# Patient Record
Sex: Female | Born: 1980 | Race: White | Hispanic: No | Marital: Single | Smoking: Former smoker
Health system: Southern US, Community
[De-identification: ages and names within clinical notes are randomized; demographics above are authoritative.]

## PROBLEM LIST (undated history)

## (undated) DIAGNOSIS — R519 Headache, unspecified: Secondary | ICD-10-CM

## (undated) DIAGNOSIS — R011 Cardiac murmur, unspecified: Secondary | ICD-10-CM

## (undated) DIAGNOSIS — Q796 Ehlers-Danlos syndrome, unspecified: Secondary | ICD-10-CM

## (undated) DIAGNOSIS — R51 Headache: Secondary | ICD-10-CM

## (undated) DIAGNOSIS — D219 Benign neoplasm of connective and other soft tissue, unspecified: Secondary | ICD-10-CM

## (undated) HISTORY — PX: WISDOM TOOTH EXTRACTION: SHX21

---

## 2015-03-13 DIAGNOSIS — N920 Excessive and frequent menstruation with regular cycle: Secondary | ICD-10-CM | POA: Insufficient documentation

## 2015-07-05 ENCOUNTER — Other Ambulatory Visit: Payer: Self-pay | Admitting: Obstetrics and Gynecology

## 2015-07-05 DIAGNOSIS — D259 Leiomyoma of uterus, unspecified: Secondary | ICD-10-CM

## 2015-07-17 ENCOUNTER — Other Ambulatory Visit: Payer: Self-pay | Admitting: Interventional Radiology

## 2015-07-17 ENCOUNTER — Ambulatory Visit
Admission: RE | Admit: 2015-07-17 | Discharge: 2015-07-17 | Disposition: A | Discharge status: Home / Self Care | Payer: Self-pay | Source: Ambulatory Visit | Attending: Obstetrics and Gynecology | Admitting: Obstetrics and Gynecology

## 2015-07-17 DIAGNOSIS — D259 Leiomyoma of uterus, unspecified: Secondary | ICD-10-CM

## 2015-07-17 HISTORY — DX: Cardiac murmur, unspecified: R01.1

## 2015-07-17 HISTORY — DX: Headache, unspecified: R51.9

## 2015-07-17 HISTORY — DX: Headache: R51

## 2015-07-17 HISTORY — DX: Benign neoplasm of connective and other soft tissue, unspecified: D21.9

## 2015-07-17 HISTORY — DX: Ehlers-Danlos syndrome, unspecified: Q79.60

## 2015-07-17 NOTE — Consult Note (Signed)
Chief Complaint: Patient was seen in consultation today for  Chief Complaint  Patient presents with  . Advice Only    Consult for [place]    at the request of [place]  Referring Physician(s): Callahan,Sidney  History of Present Illness: Shelby Park is a 35 y.o. female presenting today to [facility], kindly referred by Dr. Rogue Bussing, to discuss [place] as an option for her symptomatic uterine fibroids.    Shelby Park reports symptoms in 2 of the 3 categories related to uterine fibroids. These include bleeding and bulk-type symptoms. She reports monthly cycles, with 14 days of bleeding, heavy clots and frequent tampon and pad changes. She does report inter-. Bleeding. In addition, she reports bulk-type symptoms with urinary frequency and a sensation of incomplete bladder emptying with pelvic heaviness. She does not have significant pain.  She has had a trial of medical therapy with birth control pills prescribed in 2016 with no change in her symptoms.  She has had 2 prior pregnancies, with one child, and reports that she has no desire for future pregnancies.  She has a negative Pap smear 01/31/2015.  She has not yet had a full conversation with her OB/GYN regarding surgical options such as hysterectomy and myomectomy.  I had a lengthy discussion with her regarding the anatomy, pathology/pathophysiology of uterine fibroids, and the goals of therapy. Specifically, I mentioned medical therapy, hysterectomy/myomectomy, high intensity focused ultrasound, and uterine artery embolization as possible treatments. She has artery failed medical therapy. Regarding uterine artery embolization, I discussed the excellent efficacy, quoting a high 90% success rate at 12 months. I discussed the procedure at length, and specific risks involved with the procedure, specifically bleeding, infection, endometritis/abscess, contrast reaction, arterial injury/dissection, need for further procedure/surgery  including hysterectomy, cardiopulmonary collapse, death. I also discussed the risk of early menopause. Regarding her history of collagen vascular disease, I did quote her a risk of arterial injury slightly above a baseline risk, perhaps 2%.  I discussed the usual postoperative course including post embolization syndrome and our planned medical therapy.   Past Medical History  Diagnosis Date  . Fibroids   . Heart murmur   . Ehlers-Danlos disease   . Headache     Past Surgical History  Procedure Laterality Date  . Wisdom tooth extraction      Allergies: Review of patient's allergies indicates no known allergies.  Medications: Prior to Admission medications   Medication Sig Start Date End Date Taking? Authorizing Provider  Ascorbic Acid (VITAMIN C) 100 MG tablet Take 100 mg by mouth daily.   Yes Historical Provider, MD  vitamin B-12 (CYANOCOBALAMIN) 100 MCG tablet Take 100 mcg by mouth daily.   Yes Historical Provider, MD  vitamin E 100 UNIT capsule Take 100 Units by mouth daily.   Yes Historical Provider, MD     No family history on file.  Social History   Social History  . Marital Status: Single    Spouse Name: N/A  . Number of Children: N/A  . Years of Education: N/A   Social History Main Topics  . Smoking status: Not on file  . Smokeless tobacco: Not on file  . Alcohol Use: Not on file  . Drug Use: Not on file  . Sexual Activity: Not on file   Other Topics Concern  . Not on file   Social History Narrative  . No narrative on file      Review of Systems: A 12 point ROS discussed and pertinent positives are indicated  in the HPI above.  All other systems are negative.  Review of Systems  Vital Signs: BP 106/66 mmHg  Pulse 75  Temp(Src) 98.4 F (36.9 C) (Oral)  Resp 14  Ht 5\' 7"  (1.702 m)  Wt 170 lb (77.111 kg)  BMI 26.62 kg/m2  SpO2 100%  LMP 06/20/2015 (Approximate)  Physical Exam  Atraumatic, normocephalic. Mucous membranes moist pink. Wearing  glasses. Conjugate gaze. Alert and oriented to person place and time. Appropriate affect and questions. Neck is soft supple without adenopathy. Full range of motion. Mallampati 2. Clear to auscultation bilaterally. No labored breathing. Regular rate and rhythm. No third heart sounds appreciated. No bruit at the left or right neck. Palpable distal pulses and radial pulses. No palpable pulsatile abdominal mass. Bowel sounds positive. Nontender abdomen. Genitourinary deferred. No lower extremity edema or skin changes.  Mallampati Score: 2  Imaging: No results found.  Labs:  CBC: No results for input(s): WBC, HGB, HCT, PLT in the last 8760 hours.  COAGS: No results for input(s): INR, APTT in the last 8760 hours.  BMP: No results for input(s): NA, K, CL, CO2, GLUCOSE, BUN, CALCIUM, CREATININE, GFRNONAA, GFRAA in the last 8760 hours.  Invalid input(s): CMP  LIVER FUNCTION TESTS: No results for input(s): BILITOT, AST, ALT, ALKPHOS, PROT, ALBUMIN in the last 8760 hours.  TUMOR MARKERS: No results for input(s): AFPTM, CEA, CA199, CHROMGRNA in the last 8760 hours.  Assessment and Plan:  Shelby Park is a 35 year old female with symptomatic uterine fibroids, and I believe she is a candidate for uterine artery embolization.  I also offered her a superior hypogastric block for pain control.   Although the concept of early menopause does not concern her from the standpoint of further pregnancies, she has some concerns regarding possible hormonal imbalance. She also had a few questions regarding the option of hysterectomy and myomectomy which I felt that are best answered by surgical consultation, and she agrees that she should pursue a conversation with her OB/GYN.  We will wait to order an MRI until she has a consultation with her physician regarding hysterectomy/myomectomy.  She agrees with our plan of care and will be in touch with any future questions/concerns, or if she feels uterine  artery embolization is her choice of treatment.   Thank you for this interesting consult.  I greatly enjoyed meeting Shelby Park and look forward to participating in their care.  A copy of this report was sent to the requesting provider on this date.  Electronically Signed: Corrie Mckusick 07/17/2015, 9:27 AM   I spent a total of  40 Minutes  in face to face in clinical consultation, greater than 50% of which was counseling/coordinating care for symptomatic uterine fibroids, and possible [place].

## 2015-12-12 DIAGNOSIS — D259 Leiomyoma of uterus, unspecified: Secondary | ICD-10-CM | POA: Insufficient documentation

## 2015-12-12 DIAGNOSIS — Q796 Ehlers-Danlos syndrome, unspecified: Secondary | ICD-10-CM | POA: Insufficient documentation

## 2016-02-25 ENCOUNTER — Other Ambulatory Visit: Payer: Self-pay | Admitting: Interventional Radiology

## 2016-02-25 DIAGNOSIS — D250 Submucous leiomyoma of uterus: Secondary | ICD-10-CM

## 2016-03-05 ENCOUNTER — Ambulatory Visit (HOSPITAL_COMMUNITY)
Admission: RE | Admit: 2016-03-05 | Discharge: 2016-03-05 | Disposition: A | Discharge status: Home / Self Care | Payer: BLUE CROSS/BLUE SHIELD | Source: Ambulatory Visit | Attending: Interventional Radiology | Admitting: Interventional Radiology

## 2016-03-05 DIAGNOSIS — D250 Submucous leiomyoma of uterus: Secondary | ICD-10-CM | POA: Insufficient documentation

## 2016-03-05 DIAGNOSIS — N808 Other endometriosis: Secondary | ICD-10-CM | POA: Insufficient documentation

## 2016-03-05 DIAGNOSIS — D259 Leiomyoma of uterus, unspecified: Secondary | ICD-10-CM | POA: Diagnosis not present

## 2016-03-05 DIAGNOSIS — N801 Endometriosis of ovary: Secondary | ICD-10-CM | POA: Insufficient documentation

## 2016-03-05 MED ORDER — GADOBENATE DIMEGLUMINE 529 MG/ML IV SOLN
15.0000 mL | Freq: Once | INTRAVENOUS | Status: AC | PRN
  Administered 2016-03-05: 15 mL via INTRAVENOUS

## 2016-03-06 ENCOUNTER — Other Ambulatory Visit: Payer: Self-pay | Admitting: Interventional Radiology

## 2016-03-06 DIAGNOSIS — D259 Leiomyoma of uterus, unspecified: Secondary | ICD-10-CM

## 2016-03-13 ENCOUNTER — Ambulatory Visit
Admission: RE | Admit: 2016-03-13 | Discharge: 2016-03-13 | Disposition: A | Discharge status: Home / Self Care | Payer: BLUE CROSS/BLUE SHIELD | Source: Ambulatory Visit | Attending: Interventional Radiology | Admitting: Interventional Radiology

## 2016-03-13 DIAGNOSIS — D259 Leiomyoma of uterus, unspecified: Secondary | ICD-10-CM

## 2016-03-13 HISTORY — PX: IR GENERIC HISTORICAL: IMG1180011

## 2016-03-13 NOTE — Progress Notes (Signed)
Chief Complaint: Uterine Fibroids  Referring Physician(s): Dr. Rogue Bussing  History of Present Illness: Shelby Park is a 35 y.o. female presenting today to vascular and interventional radiology clinic as a follow-up appointment to her prior consultation of January 2017 for symptomatic uterine fibroids.   Regarding her symptom history: Shelby Park reports symptoms in 2 of the 3 categories related to uterine fibroids. These include bleeding and bulk-type symptoms. She reports monthly cycles, with 14 days of bleeding, heavy clots and frequent tampon and pad changes. She does report inter-. Bleeding. In addition, she reports bulk-type symptoms with urinary frequency and a sensation of incomplete bladder emptying with pelvic heaviness. She does not have significant pain.  She has had a trial of medical therapy with birth control pills prescribed in 2016 with no change in her symptoms.  She has had 2 prior pregnancies, with one child, and reports that she has no desire for future pregnancies.  She has a negative Pap smear 01/31/2015.  Since our prior discussion, she has had a conversation with her OB/GYN regarding surgical options including hysterectomy and myomectomy. Shelby Park tells me that the conversation was regarding high likelihood of hysterectomy in the setting of an attempted myomectomy.    She has had an MRI performed, which demonstrates no pedunculated fibroids. She does, however, have an inter-cavitary fibroid, in addition to a large anterior fibroid. On the left adnexa/ovary she has a sizable endometrioma.  Our discussion today was mostly regarding the intercavitary fibroid. Specifically, I think there is a likelihood of at a minimum spontaneous discharge, potentially over weeks to months as the fibroid degenerates. Additional risks include infection, or spontaneous delivery of a large piece of tissue, either of which may lead to a dilation and curettage or potentially  hysterectomy. Additional discussion was a review of the natural history of fibroids, the efficacy of uterine fibroid embolization -- which is approximately 95% at 12 months -- the procedure itself, and the follow-up during the first 12 months. Specific risks again discussed include bleeding, infection, injury including arterial injury given her history of connective tissue disorder, need for further procedure or surgery, contrast reaction, kidney injury, cardiopulmonary collapse, death. I also discussed with her superior hypogastric nerve block as an option.      Past Medical History:  Diagnosis Date  . Ehlers-Danlos disease   . Fibroids   . Headache   . Heart murmur     Past Surgical History:  Procedure Laterality Date  . WISDOM TOOTH EXTRACTION      Allergies: Review of patient's allergies indicates no known allergies.  Medications: Prior to Admission medications   Medication Sig Start Date End Date Taking? Authorizing Provider  Multiple Vitamin (MULTIVITAMIN) tablet Take 1 tablet by mouth daily.   Yes Historical Provider, MD  Ascorbic Acid (VITAMIN C) 100 MG tablet Take 100 mg by mouth daily.    Historical Provider, MD  Raspberry Syrup SYRP Take by mouth.    Historical Provider, MD  vitamin E 100 UNIT capsule Take 100 Units by mouth daily.    Historical Provider, MD     No family history on file.  Social History   Social History  . Marital status: Single    Spouse name: N/A  . Number of children: N/A  . Years of education: N/A   Social History Main Topics  . Smoking status: Former Research scientist (life sciences)  . Smokeless tobacco: Never Used     Comment: around age 30  . Alcohol use None  .  Drug use: Unknown  . Sexual activity: Not Asked   Other Topics Concern  . None   Social History Narrative  . None    Review of Systems: A 12 point ROS discussed and pertinent positives are indicated in the HPI above.  All other systems are negative.  Review of Systems  Vital Signs: BP  109/74 (BP Location: Left Arm, Patient Position: Sitting, Cuff Size: Normal)   Pulse 95   Temp 98.2 F (36.8 C)   Resp 12   LMP 03/11/2016 (Exact Date)   SpO2 99%   Physical Exam  Imaging: Mr Pelvis W Wo Contrast  Result Date: 03/05/2016 CLINICAL DATA:  Uterine fibroids. Chronic menometrorrhagia. Preprocedural evaluation for uterine artery embolization. EXAM: MRI PELVIS WITHOUT AND WITH CONTRAST TECHNIQUE: Multiplanar multisequence MR imaging of the pelvis was performed both before and after administration of intravenous contrast. CONTRAST:  49mL MULTIHANCE GADOBENATE DIMEGLUMINE 529 MG/ML IV SOLN COMPARISON:  None. FINDINGS: Uterus:  Measures 10.3 by 9.4 by 8.4 cm.   Estimated volume = 423 cc Fibroids: A dominant intramural fibroid is seen in the anterior corpus which measures 5.7 x 4.3 by 3.9 cm. This shows diffuse red degeneration, and no evidence of internal contrast enhancement. Several other tiny scattered small less than 1 cm intramural fibroids are noted, which show contrast enhancement. Intracavitary fibroids: Intracavitary fibroid seen in the inferior to midportion of the endometrial cavity measuring 3.1 x 2.1 x 2.6 cm. This shows contrast enhancement. Pedunculated fibroids: None. Endometrium:  Distended by intracavitary fibroid described above. Cervix/Vagina:  Appears normal. Right ovary:  Appears normal.  No mass identified. Left ovary: A few cystic areas are seen within left ovary which show T1 hyperintensity and T2 hypointensity, largest measuring 2 cm. There is also an tubular cystic structure is seen in the left adnexa which shows T1 hyperintensity and T2 hyperintensity. This measures 7.8 x 4.0 by 5.6 cm common is consistent with an endometrioma. Urinary Tract:  Bladder appears normal. Bowel:  Unremarkable visualized pelvic bowel loops. Vascular/Lymphatic: No pathologically enlarged lymph nodes. No significant vascular abnormality seen. Other:  Small amount of free pelvic fluid noted  anteriorly. Musculoskeletal:  Unremarkable. IMPRESSION: Myomatous uterus with 3.1 cm intracavitary fibroid distending endometrial cavity. No pedunculated fibroids identified. Dominant intramural fibroid in the anterior corpus measuring 5.7 cm shows diffuse red degeneration, and lack of contrast enhancement. 7.8 cm endometrioma involving the left adnexa and ovary. Electronically Signed   By: Earle Gell M.D.   On: 03/05/2016 16:44    Labs:  CBC: No results for input(s): WBC, HGB, HCT, PLT in the last 8760 hours.  COAGS: No results for input(s): INR, APTT in the last 8760 hours.  BMP: No results for input(s): NA, K, CL, CO2, GLUCOSE, BUN, CALCIUM, CREATININE, GFRNONAA, GFRAA in the last 8760 hours.  Invalid input(s): CMP  LIVER FUNCTION TESTS: No results for input(s): BILITOT, AST, ALT, ALKPHOS, PROT, ALBUMIN in the last 8760 hours.  TUMOR MARKERS: No results for input(s): AFPTM, CEA, CA199, CHROMGRNA in the last 8760 hours.  Assessment and Plan:  Shelby Park is a 35 year old female with symptomatic uterine fibroids desiring treatment with uterine artery embolization.  After our discussion today she would like to proceed, and I believe she is a good candidate.  Given the findings on her MRI, I told her I would discuss the small possibility of need for dilation and curettage with her OB surgeon should there be chronic discharge or tissue passage, or potentially infection.  At this time we  will plan on proceeding with uterine artery embolization, with possible superior hypogastric nerve block.  Electronically Signed: Corrie Mckusick 03/13/2016, 4:52 PM    I spent a total of    25 Minutes in face to face in clinical consultation, greater than 50% of which was counseling/coordinating care for symptomatic uterine fibroids, possible uterine artery embolization, possible superior hypogastric nerve block.

## 2016-03-18 ENCOUNTER — Other Ambulatory Visit: Payer: Self-pay | Admitting: Interventional Radiology

## 2016-03-18 DIAGNOSIS — D251 Intramural leiomyoma of uterus: Secondary | ICD-10-CM

## 2016-03-31 ENCOUNTER — Other Ambulatory Visit: Payer: Self-pay | Admitting: Radiology

## 2016-04-02 ENCOUNTER — Other Ambulatory Visit: Payer: Self-pay | Admitting: Radiology

## 2016-04-03 ENCOUNTER — Ambulatory Visit (HOSPITAL_COMMUNITY)
Admission: RE | Admit: 2016-04-03 | Discharge: 2016-04-03 | Disposition: A | Discharge status: Home / Self Care | Payer: BLUE CROSS/BLUE SHIELD | Source: Ambulatory Visit | Attending: Interventional Radiology | Admitting: Interventional Radiology

## 2016-04-03 ENCOUNTER — Other Ambulatory Visit: Payer: Self-pay | Admitting: General Surgery

## 2016-04-03 ENCOUNTER — Observation Stay (HOSPITAL_COMMUNITY)
Admission: RE | Admit: 2016-04-03 | Discharge: 2016-04-04 | Disposition: A | Discharge status: Home / Self Care | Payer: BLUE CROSS/BLUE SHIELD | Source: Ambulatory Visit | Attending: Interventional Radiology | Admitting: Interventional Radiology

## 2016-04-03 ENCOUNTER — Encounter (HOSPITAL_COMMUNITY): Payer: Self-pay

## 2016-04-03 DIAGNOSIS — D251 Intramural leiomyoma of uterus: Secondary | ICD-10-CM | POA: Diagnosis present

## 2016-04-03 DIAGNOSIS — Q796 Ehlers-Danlos syndrome: Secondary | ICD-10-CM | POA: Diagnosis not present

## 2016-04-03 DIAGNOSIS — D259 Leiomyoma of uterus, unspecified: Secondary | ICD-10-CM | POA: Diagnosis present

## 2016-04-03 HISTORY — PX: IR GENERIC HISTORICAL: IMG1180011

## 2016-04-03 LAB — CBC WITH DIFFERENTIAL/PLATELET
BASOS PCT: 0 %
Basophils Absolute: 0 10*3/uL (ref 0.0–0.1)
EOS ABS: 0.3 10*3/uL (ref 0.0–0.7)
Eosinophils Relative: 4 %
HCT: 35.7 % — ABNORMAL LOW (ref 36.0–46.0)
Hemoglobin: 10.5 g/dL — ABNORMAL LOW (ref 12.0–15.0)
Lymphocytes Relative: 26 %
Lymphs Abs: 2.2 10*3/uL (ref 0.7–4.0)
MCH: 22.1 pg — AB (ref 26.0–34.0)
MCHC: 29.4 g/dL — AB (ref 30.0–36.0)
MCV: 75 fL — ABNORMAL LOW (ref 78.0–100.0)
MONO ABS: 0.5 10*3/uL (ref 0.1–1.0)
Monocytes Relative: 6 %
NEUTROS ABS: 5.3 10*3/uL (ref 1.7–7.7)
NEUTROS PCT: 64 %
PLATELETS: 336 10*3/uL (ref 150–400)
RBC: 4.76 MIL/uL (ref 3.87–5.11)
RDW: 21.3 % — ABNORMAL HIGH (ref 11.5–15.5)
WBC: 8.3 10*3/uL (ref 4.0–10.5)

## 2016-04-03 LAB — PROTIME-INR
INR: 0.99
Prothrombin Time: 13.1 seconds (ref 11.4–15.2)

## 2016-04-03 LAB — BASIC METABOLIC PANEL
Anion gap: 6 (ref 5–15)
BUN: 11 mg/dL (ref 6–20)
CALCIUM: 9 mg/dL (ref 8.9–10.3)
CO2: 24 mmol/L (ref 22–32)
CREATININE: 0.59 mg/dL (ref 0.44–1.00)
Chloride: 107 mmol/L (ref 101–111)
GLUCOSE: 95 mg/dL (ref 65–99)
Potassium: 3.6 mmol/L (ref 3.5–5.1)
Sodium: 137 mmol/L (ref 135–145)

## 2016-04-03 LAB — HCG, SERUM, QUALITATIVE: PREG SERUM: NEGATIVE

## 2016-04-03 MED ORDER — HYDROMORPHONE 1 MG/ML IV SOLN
INTRAVENOUS | Status: DC
  Administered 2016-04-03: 11:00:00 via INTRAVENOUS

## 2016-04-03 MED ORDER — MIDAZOLAM HCL 2 MG/2ML IJ SOLN
INTRAMUSCULAR | Status: AC | PRN
  Administered 2016-04-03 (×4): 1 mg via INTRAVENOUS
  Administered 2016-04-03: 0.5 mg via INTRAVENOUS
  Administered 2016-04-03 (×5): 1 mg via INTRAVENOUS

## 2016-04-03 MED ORDER — ONDANSETRON HCL 4 MG/2ML IJ SOLN
4.0000 mg | Freq: Once | INTRAMUSCULAR | Status: AC
  Administered 2016-04-03: 4 mg via INTRAVENOUS
  Filled 2016-04-03: qty 2

## 2016-04-03 MED ORDER — ONDANSETRON HCL 4 MG/2ML IJ SOLN: 4.0000 mg | Freq: Four times a day (QID) | INTRAMUSCULAR | Status: DC | PRN

## 2016-04-03 MED ORDER — ONDANSETRON HCL 4 MG/2ML IJ SOLN
4.0000 mg | Freq: Four times a day (QID) | INTRAMUSCULAR | Status: DC
  Administered 2016-04-03: 4 mg via INTRAVENOUS
  Filled 2016-04-03: qty 2

## 2016-04-03 MED ORDER — LIDOCAINE HCL 1 % IJ SOLN
INTRAMUSCULAR | Status: AC
  Filled 2016-04-03: qty 20

## 2016-04-03 MED ORDER — DIPHENHYDRAMINE HCL 12.5 MG/5ML PO ELIX
12.5000 mg | ORAL_SOLUTION | Freq: Four times a day (QID) | ORAL | Status: DC | PRN
  Filled 2016-04-03: qty 5

## 2016-04-03 MED ORDER — FLUMAZENIL 0.5 MG/5ML IV SOLN
INTRAVENOUS | Status: AC
  Filled 2016-04-03: qty 5

## 2016-04-03 MED ORDER — DIPHENHYDRAMINE HCL 50 MG/ML IJ SOLN: 12.5000 mg | Freq: Four times a day (QID) | INTRAMUSCULAR | Status: DC | PRN

## 2016-04-03 MED ORDER — NALOXONE HCL 0.4 MG/ML IJ SOLN: 0.4000 mg | INTRAMUSCULAR | Status: DC | PRN

## 2016-04-03 MED ORDER — SODIUM CHLORIDE 0.9 % IV SOLN
INTRAVENOUS | Status: AC
  Administered 2016-04-03 (×2): via INTRAVENOUS

## 2016-04-03 MED ORDER — KETOROLAC TROMETHAMINE 30 MG/ML IJ SOLN: 30.0000 mg | INTRAMUSCULAR | Status: DC

## 2016-04-03 MED ORDER — SODIUM CHLORIDE 0.9 % IV SOLN
8.0000 mg | Freq: Four times a day (QID) | INTRAVENOUS | Status: DC
  Administered 2016-04-03 – 2016-04-04 (×3): 8 mg via INTRAVENOUS
  Filled 2016-04-03 (×5): qty 4

## 2016-04-03 MED ORDER — MIDAZOLAM HCL 2 MG/2ML IJ SOLN
INTRAMUSCULAR | Status: AC
  Filled 2016-04-03: qty 4

## 2016-04-03 MED ORDER — KETOROLAC TROMETHAMINE 30 MG/ML IJ SOLN
30.0000 mg | Freq: Three times a day (TID) | INTRAMUSCULAR | Status: DC
  Filled 2016-04-03 (×4): qty 1

## 2016-04-03 MED ORDER — SODIUM CHLORIDE 0.9 % IV SOLN
INTRAVENOUS | Status: DC
  Administered 2016-04-03: 08:00:00 via INTRAVENOUS

## 2016-04-03 MED ORDER — NALOXONE HCL 0.4 MG/ML IJ SOLN
INTRAMUSCULAR | Status: AC
  Filled 2016-04-03: qty 1

## 2016-04-03 MED ORDER — MORPHINE SULFATE (PF) 2 MG/ML IV SOLN
2.0000 mg | INTRAVENOUS | Status: DC | PRN
  Administered 2016-04-03 – 2016-04-04 (×3): 2 mg via INTRAVENOUS
  Filled 2016-04-03 (×3): qty 1

## 2016-04-03 MED ORDER — FENTANYL CITRATE (PF) 100 MCG/2ML IJ SOLN
INTRAMUSCULAR | Status: AC | PRN
  Administered 2016-04-03 (×6): 50 ug via INTRAVENOUS
  Administered 2016-04-03: 25 ug via INTRAVENOUS
  Administered 2016-04-03 (×3): 50 ug via INTRAVENOUS

## 2016-04-03 MED ORDER — IOPAMIDOL (ISOVUE-300) INJECTION 61%
100.0000 mL | Freq: Once | INTRAVENOUS | Status: AC | PRN
  Administered 2016-04-03: 40 mL via INTRA_ARTERIAL

## 2016-04-03 MED ORDER — FENTANYL CITRATE (PF) 100 MCG/2ML IJ SOLN
INTRAMUSCULAR | Status: AC
  Filled 2016-04-03: qty 6

## 2016-04-03 MED ORDER — KETOROLAC TROMETHAMINE 60 MG/2ML IM SOLN
60.0000 mg | Freq: Once | INTRAMUSCULAR | Status: AC
  Administered 2016-04-03: 60 mg via INTRAMUSCULAR
  Filled 2016-04-03 (×2): qty 2

## 2016-04-03 MED ORDER — HYDROMORPHONE HCL 1 MG/ML IJ SOLN
1.0000 mg | Freq: Once | INTRAMUSCULAR | Status: AC
  Administered 2016-04-03: 1 mg via INTRAVENOUS
  Filled 2016-04-03: qty 1

## 2016-04-03 MED ORDER — PROMETHAZINE HCL 25 MG/ML IJ SOLN
6.2500 mg | Freq: Four times a day (QID) | INTRAMUSCULAR | Status: DC | PRN
  Administered 2016-04-03 – 2016-04-04 (×3): 6.25 mg via INTRAVENOUS
  Filled 2016-04-03 (×3): qty 1

## 2016-04-03 MED ORDER — MIDAZOLAM HCL 2 MG/2ML IJ SOLN
INTRAMUSCULAR | Status: AC
  Filled 2016-04-03: qty 6

## 2016-04-03 MED ORDER — CEFAZOLIN SODIUM-DEXTROSE 2-4 GM/100ML-% IV SOLN
2.0000 g | INTRAVENOUS | Status: AC
  Administered 2016-04-03: 2 g via INTRAVENOUS
  Filled 2016-04-03: qty 100

## 2016-04-03 MED ORDER — HYDROMORPHONE 1 MG/ML IV SOLN
INTRAVENOUS | Status: AC
  Filled 2016-04-03: qty 25

## 2016-04-03 MED ORDER — FENTANYL CITRATE (PF) 100 MCG/2ML IJ SOLN
INTRAMUSCULAR | Status: AC
  Filled 2016-04-03: qty 4

## 2016-04-03 MED ORDER — KETOROLAC TROMETHAMINE 30 MG/ML IJ SOLN
30.0000 mg | Freq: Three times a day (TID) | INTRAMUSCULAR | Status: DC
  Administered 2016-04-04 (×3): 30 mg via INTRAVENOUS
  Filled 2016-04-03: qty 1

## 2016-04-03 MED ORDER — IOPAMIDOL (ISOVUE-300) INJECTION 61%
100.0000 mL | Freq: Once | INTRAVENOUS | Status: AC | PRN
  Administered 2016-04-03: 17 mL via INTRAVENOUS

## 2016-04-03 MED ORDER — SODIUM CHLORIDE 0.9% FLUSH: 9.0000 mL | INTRAVENOUS | Status: DC | PRN

## 2016-04-03 MED ORDER — LIDOCAINE HCL 1 % IJ SOLN
INTRAMUSCULAR | Status: AC | PRN
  Administered 2016-04-03: 10 mL via INTRADERMAL

## 2016-04-03 NOTE — Progress Notes (Signed)
Patient ID: Shelby Park, female   DOB: [birth year], 35 y.o.   MRN: [id number] Patient with some mild intermittent pelvic cramping/ few dry heaves earlier; has had a few ice chips Vital signs stable ,afebrile Puncture site right common femoral artery soft, clean, dry, nontender, no hematoma, intact distal pulses. A/P: Status post bilateral uterine artery embolization for symptomatic uterine fibroids earlier today; for overnight observation; Dilaudid PCA for pain control, antiemetics as needed; DC Foley catheter later this evening; follow-up in IR clinic with Dr. Earleen Newport in 1 month

## 2016-04-03 NOTE — H&P (Signed)
    Referring Physician(s): Callahan,S  Supervising Physician: Corrie Mckusick  Patient Status:  Outpatient TBA  Chief Complaint: Symptomatic uterine fibroids   Subjective: Pt familiar to IR service from prior consultation on 07/17/15 with Dr. Earleen Newport regarding treatment options for symptomatic uterine fibroids. She was also seen in follow-up on 03/13/16 to further discuss bilateral uterine artery embolization and deemed an appropriate candidate for the procedure. She presents today for the procedure. She currently denies fever, headache, chest pain, dyspnea, cough, worsening abdominal pain, back pain, nausea, vomiting, hematuria or dysuria. She does have menorrhagia along with urinary frequency and pelvic heaviness. Additional history as below. Past Medical History:  Diagnosis Date  . Ehlers-Danlos disease   . Fibroids   . Headache   . Heart murmur    Past Surgical History:  Procedure Laterality Date  . WISDOM TOOTH EXTRACTION        Allergies: Review of patient's allergies indicates no known allergies.  Medications: Prior to Admission medications   Medication Sig Start Date End Date Taking? Authorizing Provider  Ascorbic Acid (VITAMIN C) 100 MG tablet Take 100 mg by mouth daily.   Yes Historical Provider, MD  Multiple Vitamin (MULTIVITAMIN) tablet Take 1 tablet by mouth daily.   Yes Historical Provider, MD  Raspberry Syrup SYRP Take by mouth.   Yes Historical Provider, MD  vitamin E 100 UNIT capsule Take 100 Units by mouth daily.   Yes Historical Provider, MD     Vital Signs: BP 129/83 (BP Location: Right Arm)   Pulse 79   Temp 98.3 F (36.8 C) (Oral)   Resp 16   LMP 03/11/2016 (Exact Date)   SpO2 100%   Physical Exam patient awake, alert. Chest clear to auscultation bilaterally. Heart with regular rate and rhythm. Abdomen soft, positive bowel sounds, nontender. Lower extremities with no edema.  Imaging: No results found.  Labs:  CBC: No results for input(s):  WBC, HGB, HCT, PLT in the last 8760 hours.  COAGS: No results for input(s): INR, APTT in the last 8760 hours.  BMP: No results for input(s): NA, K, CL, CO2, GLUCOSE, BUN, CALCIUM, CREATININE, GFRNONAA, GFRAA in the last 8760 hours.  Invalid input(s): CMP  LIVER FUNCTION TESTS: No results for input(s): BILITOT, AST, ALT, ALKPHOS, PROT, ALBUMIN in the last 8760 hours.  Assessment and Plan: Patient with history of symptomatic uterine fibroids. Seen in consultation by Dr. Earleen Newport in January and again in September of this year to discuss treatment options and deemed an appropriate candidate for bilateral uterine artery embolization. She presents today for the procedure. Details/risks of procedure, including but not limited to, internal bleeding, infection, contrast nephropathy, nontarget embolization discussed with patient and mother with their understanding and consent. Post procedure she will be admitted for overnight observation for pain control.   Electronically Signed: D. Rowe Park 04/03/2016, 8:28 AM   I spent a total of 30 minutes at the the patient's bedside AND on the patient's hospital floor or unit, greater than 50% of which was counseling/coordinating care for bilateral uterine artery embolization    Patient ID: Shelby Park, female   DOB: January 20, 1981, 35 y.o.   MRN: [id number]

## 2016-04-03 NOTE — Procedures (Signed)
Interventional Radiology Procedure Note  Procedure: US guided access of RCFA for bilateral uterine artery embolization.    Exoseal deployed R CFA.  Marland Kitchen  Complications: None  Recommendations:  - Observe overnight - Right leg straight for 4 hours - Maintain foley until patient is ambulating comfortably, most likely removal at 6am Friday. - PCA for analgesia - SCHEDULED Q8 hour IV toradol - SCHEDULED 4mg  IV zofran - Do not submerge for 7 days - Routine wound care - Anticipate DC in am.  - DC with 4 Rx's: Vicodin, Ibuprofen, Colace, & promethazine Encourage adequate hydration and avoid caffeine x 7 days - Follow up with Dr. Earleen Park in [facility] in 4-6 weeks   Signed,  Shelby Park. Shelby Newport, DO

## 2016-04-04 ENCOUNTER — Encounter (HOSPITAL_COMMUNITY): Payer: Self-pay | Admitting: Radiology

## 2016-04-04 ENCOUNTER — Other Ambulatory Visit: Payer: Self-pay | Admitting: Radiology

## 2016-04-04 DIAGNOSIS — D259 Leiomyoma of uterus, unspecified: Secondary | ICD-10-CM

## 2016-04-04 DIAGNOSIS — D251 Intramural leiomyoma of uterus: Secondary | ICD-10-CM | POA: Diagnosis not present

## 2016-04-04 NOTE — Progress Notes (Signed)
Discharge instructions given to patient. No further questions at this time. Patient transported via wheelchair.

## 2016-04-04 NOTE — Progress Notes (Signed)
Nursing Note: Discontinued dilaudid pca per orders.Pt says she felt nauseated after giving herself doses.Morphine 2 mg given and 8 mg of zofran.Report given wbb

## 2016-04-04 NOTE — Discharge Instructions (Signed)
Uterine Artery Embolization for Fibroids, Care After °Refer to this sheet in the next few weeks. These instructions provide you with information on caring for yourself after your procedure. Your health care provider may also give you more specific instructions. Your treatment has been planned according to current medical practices, but problems sometimes occur. Call your health care provider if you have any problems or questions after your procedure. °WHAT TO EXPECT AFTER THE PROCEDURE °After your procedure, it is typical to have cramping in the pelvis. You will be given pain medicine to control it. °HOME CARE INSTRUCTIONS °· Only take over-the-counter or prescription medicines for pain, discomfort, or fever as directed by your health care provider. °· Do not take aspirin. It can cause bleeding. °· Follow your health care provider's advice regarding medicines given to you, diet, activity, and when to begin sexual activity. °· See your health care provider for follow-up care as directed. °SEEK MEDICAL CARE IF: °· You have a fever. °· You have redness, swelling, and pain around your incision site. °· You have pus draining from your incision. °· You have a rash. °SEEK IMMEDIATE MEDICAL CARE IF: °· You have bleeding from your incision site. °· You have difficulty breathing. °· You have chest pain. °· You have severe abdominal pain. °· You have leg pain. °· You become dizzy and faint. °  °This information is not intended to replace advice given to you by your health care provider. Make sure you discuss any questions you have with your health care provider. °  °Document Released: 04/06/2013 Document Reviewed: 04/06/2013 °[employer]vier Interactive Patient Education ©2016 [employer] ° °

## 2016-04-04 NOTE — Discharge Summary (Signed)
Patient ID: Shelby Park MRN: [id number] DOB/AGE: August 24, 1980 35 y.o.  Admit date: 04/03/2016 Discharge date: 04/04/2016  Supervising Physician: Corrie Mckusick  Admission Diagnoses: Symptomatic uterine fibroids  Discharge Diagnoses: Symptomatic uterine fibroids, status post successful bilateral uterine artery embolization on 04/03/16 Active Problems:   Fibroid, uterine  Past Medical History:  Diagnosis Date  . Ehlers-Danlos disease   . Fibroids   . Headache   . Heart murmur    Past Surgical History:  Procedure Laterality Date  . IR GENERIC HISTORICAL  04/03/2016   IR US GUIDE VASC ACCESS RIGHT 04/03/2016 Corrie Mckusick, DO [facility]  . IR GENERIC HISTORICAL  04/03/2016   IR ANGIOGRAM PELVIS SELECTIVE OR SUPRASELECTIVE 04/03/2016 Corrie Mckusick, DO [facility]  . IR GENERIC HISTORICAL  04/03/2016   IR ANGIOGRAM PELVIS SELECTIVE OR SUPRASELECTIVE 04/03/2016 Corrie Mckusick, DO [facility]  . IR GENERIC HISTORICAL  04/03/2016   IR ANGIOGRAM SELECTIVE EACH ADDITIONAL VESSEL 04/03/2016 Corrie Mckusick, DO [facility]  . IR GENERIC HISTORICAL  04/03/2016   IR ANGIOGRAM SELECTIVE EACH ADDITIONAL VESSEL 04/03/2016 Corrie Mckusick, DO [facility]  . IR GENERIC HISTORICAL  04/03/2016   IR EMBO TUMOR ORGAN ISCHEMIA INFARCT INC GUIDE ROADMAPPING 04/03/2016 Corrie Mckusick, DO [facility]  . WISDOM TOOTH EXTRACTION       Discharged Condition: good  Hospital Course: Shelby Park is a 35 year old female with history of symptomatic uterine fibroids who was initially seen in consultation by Dr. Earleen Newport on 07/17/15 to discuss treatment options. She was deemed an appropriate candidate for bilateral uterine artery embolization and presented to [facility] on 04/03/16 for the procedure. The procedure was performed without immediate complications and she was subsequently admitted to the hospital for overnight observation for pain control. She was placed on a Dilaudid PCA pump and given antiemetics as needed.  She did fairly well with the only exception of some mild intermittent pelvic cramping and occasional nausea and vomiting. Her period was also beginning at this time. On the day of discharge she was stable. She was able to tolerate her diet, ambulate and void without significant difficulty. Her nausea had resolved and pelvic cramping was minimal .She was given prescriptions for Norco 5/325, #30, no refills, 1-2 tablets every 4-6 hours as needed for moderate to severe pain. Zofran 8 mg, #20, no refills, 1 tablet twice daily as needed for nausea. Ibuprofen 600 mg, #20, 1 tablet every 6 hours for the next 5 days. Colace 100 mg, #20, no refills, 1 tablet twice daily as needed for constipation. She will be scheduled for follow-up with Dr. Earleen Newport in the [place] clinic in approximately one month. She will continue her current home medications and follow up with Dr. Rogue Bussing as needed. She was told to contact our service with any additional questions or concerns.  Consults: none  Significant Diagnostic Studies:  Results for orders placed or performed during the hospital encounter of [id number]  Basic metabolic panel  Result Value Ref Range   Sodium 137 135 - 145 mmol/L   Potassium 3.6 3.5 - 5.1 mmol/L   Chloride 107 101 - 111 mmol/L   CO2 24 22 - 32 mmol/L   Glucose, Bld 95 65 - 99 mg/dL   BUN 11 6 - 20 mg/dL   Creatinine, Ser 0.59 0.44 - 1.00 mg/dL   Calcium 9.0 8.9 - 10.3 mg/dL   GFR calc non Af Amer >60 >60 mL/min   GFR calc Af Amer >60 >60 mL/min   Anion gap 6 5 -  15  CBC with Differential/Platelet  Result Value Ref Range   WBC 8.3 4.0 - 10.5 K/uL   RBC 4.76 3.87 - 5.11 MIL/uL   Hemoglobin 10.5 (L) 12.0 - 15.0 g/dL   HCT 35.7 (L) 36.0 - 46.0 %   MCV 75.0 (L) 78.0 - 100.0 fL   MCH 22.1 (L) 26.0 - 34.0 pg   MCHC 29.4 (L) 30.0 - 36.0 g/dL   RDW 21.3 (H) 11.5 - 15.5 %   Platelets 336 150 - 400 K/uL   Neutrophils Relative % 64 %   Lymphocytes Relative 26 %   Monocytes Relative 6 %   Eosinophils  Relative 4 %   Basophils Relative 0 %   Neutro Abs 5.3 1.7 - 7.7 K/uL   Lymphs Abs 2.2 0.7 - 4.0 K/uL   Monocytes Absolute 0.5 0.1 - 1.0 K/uL   Eosinophils Absolute 0.3 0.0 - 0.7 K/uL   Basophils Absolute 0.0 0.0 - 0.1 K/uL   Smear Review MORPHOLOGY UNREMARKABLE   hCG, serum, qualitative  Result Value Ref Range   Preg, Serum NEGATIVE NEGATIVE  Protime-INR  Result Value Ref Range   Prothrombin Time 13.1 11.4 - 15.2 seconds   INR 0.99      Treatments: Successful bilateral uterine artery embolization on 04/03/16 via IV conscious sedation  Discharge Exam: Blood pressure 116/78, pulse 62, temperature 98.8 F (37.1 C), temperature source Oral, resp. rate 16, height 5\' 6"  (1.676 m), weight 187 lb (84.8 kg), last menstrual period 03/11/2016, SpO2 100 %. Patient awake, alert. Chest clear to auscultation bilaterally. Heart with regular rate and rhythm. Abdomen soft, positive bowel sounds, mildly tender pelvic region. Lower extremities with no significant edema, intact distal pulses. Puncture site right common femoral artery clean, dry, soft, minimal tenderness, no distinct hematoma.  Disposition: home  Discharge Instructions    Call MD for:  difficulty breathing, headache or visual disturbances    Complete by:  As directed    Call MD for:  extreme fatigue    Complete by:  As directed    Call MD for:  hives    Complete by:  As directed    Call MD for:  persistant dizziness or light-headedness    Complete by:  As directed    Call MD for:  persistant nausea and vomiting    Complete by:  As directed    Call MD for:  redness, tenderness, or signs of infection (pain, swelling, redness, odor or green/yellow discharge around incision site)    Complete by:  As directed    Call MD for:  severe uncontrolled pain    Complete by:  As directed    Call MD for:  temperature >100.4    Complete by:  As directed    Change dressing (specify)    Complete by:  As directed    May change dressing over  right groin later today and apply Band-Aid to site for the next 2-3 days. May wash site with soap and water   Diet - low sodium heart healthy    Complete by:  As directed    Discharge instructions    Complete by:  As directed    Stay well hydrated; radiology service will call you with follow up appointment with Dr. Earleen Newport in approximately 1 month; call [phone] or [phone] with any questions   Driving Restrictions    Complete by:  As directed    No driving for next 48 hours   Increase activity slowly  Complete by:  As directed    Lifting restrictions    Complete by:  As directed    No heavy lifting for the next 3-4 days   May shower / Bathe    Complete by:  As directed    May walk up steps    Complete by:  As directed    Sexual Activity Restrictions    Complete by:  As directed    No sexual intercourse for 1 week       Medication List    TAKE these medications   multivitamin tablet Take 1 tablet by mouth daily.   OVER THE COUNTER MEDICATION Take 1 capsule by mouth daily. Red raspberry leaf extract   vitamin C 500 MG tablet Commonly known as:  ASCORBIC ACID Take 500 mg by mouth daily.   VITAMIN E PO Take 1 capsule by mouth daily.      Follow-up Information    WAGNER, JAIME, DO .   Specialty:  Interventional Radiology Why:  Radiology will call you with follow up appointment with Dr. Earleen Newport in the [place] clinic in 1 month; call [phone] or [phone] with any questions Contact information: [address] [place] [address] [phone]        Allyn Kenner, DO .   Specialty:  Obstetrics and Gynecology Contact information: [address] [place] [address] [phone]            Electronically Signed: D. Rowe Robert 04/04/2016, 11:46 AM   I have spent less than 30 minutes discharging Shelby Park.

## 2016-04-17 ENCOUNTER — Ambulatory Visit
Admission: RE | Admit: 2016-04-17 | Discharge: 2016-04-17 | Disposition: A | Discharge status: Home / Self Care | Payer: BLUE CROSS/BLUE SHIELD | Source: Ambulatory Visit | Attending: Radiology | Admitting: Radiology

## 2016-04-17 DIAGNOSIS — D259 Leiomyoma of uterus, unspecified: Secondary | ICD-10-CM

## 2016-04-17 HISTORY — PX: IR GENERIC HISTORICAL: IMG1180011

## 2016-04-17 NOTE — Progress Notes (Signed)
Chief Complaint: Post [place] for symptomatic fibroids.   Referring Physician(s): Dr Rogue Bussing  History of Present Illness: Shelby Park is a 35 y.o. female presenting today as a scheduled follow-up, status post bilateral uterine artery embolization for symptomatic uterine fibroids.  We performed the angiogram and uterine artery embolization 04/03/2016. She was observed overnight in the hospital for 1 evening, and although she did experience some nausea overnight secondary to the PCA, she was discharged home the following morning.  Since her discharge, she tells me she has been feeling quite well. 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 She did not have significant pain or cramping, and did not have any low-grade fever. She has been able to return to her activities of daily living comfortably, and she feels her usual self.    She tells me she believes she has had 1 menstrual cycle, with small clots, significantly less bleeding than previously. I did have a discussion with her regarding the expectations and the first 3-6 months, and that the degree of bleeding can be very unpredictable. Some patients have significant bleeding in the first 3-6 months, and some patients have no bleeding at all. This whole spectrum is very unpredictable, and all of this response is normal. It is not until the 12 month time frame that we would determine whether there has been a failure or not, however, success rate for controlling symptoms has been cited 97% in some literature.  She has not had any significant tissue passage, and no significant cramping. She does not require any ongoing analgesics.  She has not had any problems with the puncture site in her hip, and the small bruising and soreness has resolved.    Past Medical History:  Diagnosis Date  . Ehlers-Danlos disease   . Fibroids     . Headache   . Heart murmur     Past Surgical History:  Procedure Laterality Date  . IR GENERIC HISTORICAL  04/03/2016   IR US GUIDE VASC ACCESS RIGHT 04/03/2016 Corrie Mckusick, DO [facility]  . IR GENERIC HISTORICAL  04/03/2016   IR ANGIOGRAM PELVIS SELECTIVE OR SUPRASELECTIVE 04/03/2016 Corrie Mckusick, DO [facility]  . IR GENERIC HISTORICAL  04/03/2016   IR ANGIOGRAM PELVIS SELECTIVE OR SUPRASELECTIVE 04/03/2016 Corrie Mckusick, DO [facility]  . IR GENERIC HISTORICAL  04/03/2016   IR ANGIOGRAM SELECTIVE EACH ADDITIONAL VESSEL 04/03/2016 Corrie Mckusick, DO [facility]  . IR GENERIC HISTORICAL  04/03/2016   IR ANGIOGRAM SELECTIVE EACH ADDITIONAL VESSEL 04/03/2016 Corrie Mckusick, DO [facility]  . IR GENERIC HISTORICAL  04/03/2016   IR EMBO TUMOR ORGAN ISCHEMIA INFARCT INC GUIDE ROADMAPPING 04/03/2016 Corrie Mckusick, DO [facility]  . WISDOM TOOTH EXTRACTION      Allergies: Other  Medications: Prior to Admission medications   Medication Sig Start Date End Date Taking? Authorizing Provider  Multiple Vitamin (MULTIVITAMIN) tablet Take 1 tablet by mouth daily.    Historical Provider, MD  OVER THE COUNTER MEDICATION Take 1 capsule by mouth daily. Red raspberry leaf extract    Historical Provider, MD  vitamin C (ASCORBIC ACID) 500 MG tablet Take 500 mg by mouth daily.    Historical Provider, MD  VITAMIN E PO Take 1 capsule by mouth daily.    Historical Provider, MD     No family history on file.  Social History   Social History  . Marital status: Single  Spouse name: N/A  . Number of children: N/A  . Years of education: N/A   Social History Main Topics  . Smoking status: Former Research scientist (life sciences)  . Smokeless tobacco: Never Used     Comment: around age 76  . Alcohol use Not on file  . Drug use: Unknown  . Sexual activity: Not on file   Other Topics Concern  . Not on file   Social History Narrative  . No narrative on file     Review of Systems: A 12 point ROS discussed and pertinent  positives are indicated in the HPI above.  All other systems are negative.  Review of Systems  Vital Signs: BP (P) 119/72 (BP Location: Left Arm, Patient Position: Sitting, Cuff Size: Normal)   Pulse (P) 77   Temp (P) 98.1 F (36.7 C) (Oral)   LMP 04/16/2016 (Approximate)   SpO2 (P) 99%   Physical Exam  Mallampati Score:     Imaging: Ir Angiogram Pelvis Selective Or Supraselective  Result Date: 04/03/2016 INDICATION: 35 year old female with a history of symptomatic uterine fibroids. She has additional history of Ehlers-Danlos syndrome. She has been referred for uterine fibroid embolization. EXAM: IR EMBO ARTERIAL NOT HEMORR HEMANG INC GUIDE ROADMAPPING; ADDITIONAL ARTERIOGRAPHY; IR ULTRASOUND GUIDANCE VASC ACCESS RIGHT; PELVIC SELECTIVE ARTERIOGRAPHY MEDICATIONS: 2.0 g Ancef. The antibiotic was administered within 1 hour of the procedure ANESTHESIA/SEDATION: Moderate (conscious) sedation was employed during this procedure. A total of Versed 9.5 mg and Fentanyl for 75 mcg was administered intravenously. Moderate Sedation Time: 71 minutes. The patient's level of consciousness and vital signs were monitored continuously by radiology nursing throughout the procedure under my direct supervision. CONTRAST:  76mL ISOVUE-300 IOPAMIDOL (ISOVUE-300) INJECTION 61%, 65mL ISOVUE-300 IOPAMIDOL (ISOVUE-300) INJECTION 61% FLUOROSCOPY TIME:  Fluoroscopy Time: 18 minutes 0 seconds (2292 mGy). COMPLICATIONS: None PROCEDURE: The procedure, risks, benefits, and alternatives were explained to the patient. Questions regarding the procedure were encouraged and answered. The patient understands and consents to the procedure. The right groin was prepped with Betadine in a sterile fashion, and a sterile drape was applied covering the operative field. A sterile gown and sterile gloves were used for the procedure. Local anesthesia was provided with 1% Lidocaine. Ultrasound survey of the right inguinal region was performed  with images stored and sent to PACs. The skin and subcutaneous tissue generously infiltrated with 1% lidocaine for local anesthesia, and a micropuncture needle was advanced under ultrasound guidance into the right common femoral artery. With excellent blood flow returned, micro wire was advanced. The needle was removed and a micropuncture set was advanced over the micro wire. The inner dilator in the stylet were removed, and an 035 Bentson wire was advanced into the abdominal aorta. The micro sheath was removed, a 5 [place] vascular sheath was placed into the common femoral artery. The dilator was removed and the sheath was flushed. A omni Flush catheter was advanced over the Bentson wire and used to select the left common iliac artery. The wire was advanced into the internal iliac artery, and the Omni Flush catheter was exchanged for a 5 [place] cobra catheter. Cobra catheter was used to select the internal iliac artery. Angiogram of the left hypogastric artery was performed for identification of the left uterine artery. Once we identified the origin, a high-flow micro catheter was advanced to the Cobra catheter with a 0.014 Fathom wire. The micro catheter and micro wire combination were used to select the uterine artery. Angiogram was performed. Once we confirmed position the micro  catheter within the distal descending left uterine artery, embolization was performed. Embolization of the left uterine artery was performed with 2.0 vial of embospheres (500 - 700 micron). Embolization was performed to 5 beat stasis. The micro wire/micro catheter were removed, and the Cobra catheter and Bentson wire were used to form a Waltman loop. The Waltman loop was then used to select the ipsilateral hypogastric artery. Angiogram was performed to confirm position and identified the origin of the urine artery. Once we confirmed the origin, the same micro catheter and micro wire combination were used to select the uterine artery. The  catheter was advanced to the distal descending right uterine artery. Angiogram was performed to confirm position. Embolization of the right uterine artery was performed with 1.5 vials of 500 -700 micro embospheres. Five beat stasis was achieved. The Waltman loop was reduced via the left common iliac artery with the assistance of the Bentson wire, and then the catheter and wire were removed. Angiogram of the right common femoral artery was performed. An Exoseal device was deployed for hemostasis. The patient tolerated the procedure well and remained hemodynamically stable throughout. No complications were encountered and no significant blood loss was encountered. IMPRESSION: Status post right common femoral artery access for bilateral uterine artery embolization as a treatment for symptomatic uterine fibroids. Exoseal was deployed for hemostasis. Signed, Dulcy Fanny. Earleen Newport, DO [facility] Radiology Electronically Signed   By: Corrie Mckusick D.O.   On: 04/03/2016 12:01   Ir Angiogram Pelvis Selective Or Supraselective  Result Date: 04/03/2016 INDICATION: 35 year old female with a history of symptomatic uterine fibroids. She has additional history of Ehlers-Danlos syndrome. She has been referred for uterine fibroid embolization. EXAM: IR EMBO ARTERIAL NOT HEMORR HEMANG INC GUIDE ROADMAPPING; ADDITIONAL ARTERIOGRAPHY; IR ULTRASOUND GUIDANCE VASC ACCESS RIGHT; PELVIC SELECTIVE ARTERIOGRAPHY MEDICATIONS: 2.0 g Ancef. The antibiotic was administered within 1 hour of the procedure ANESTHESIA/SEDATION: Moderate (conscious) sedation was employed during this procedure. A total of Versed 9.5 mg and Fentanyl for 75 mcg was administered intravenously. Moderate Sedation Time: 71 minutes. The patient's level of consciousness and vital signs were monitored continuously by radiology nursing throughout the procedure under my direct supervision. CONTRAST:  70mL ISOVUE-300 IOPAMIDOL  (ISOVUE-300) INJECTION 61%, 38mL ISOVUE-300 IOPAMIDOL (ISOVUE-300) INJECTION 61% FLUOROSCOPY TIME:  Fluoroscopy Time: 18 minutes 0 seconds (2292 mGy). COMPLICATIONS: None PROCEDURE: The procedure, risks, benefits, and alternatives were explained to the patient. Questions regarding the procedure were encouraged and answered. The patient understands and consents to the procedure. The right groin was prepped with Betadine in a sterile fashion, and a sterile drape was applied covering the operative field. A sterile gown and sterile gloves were used for the procedure. Local anesthesia was provided with 1% Lidocaine. Ultrasound survey of the right inguinal region was performed with images stored and sent to PACs. The skin and subcutaneous tissue generously infiltrated with 1% lidocaine for local anesthesia, and a micropuncture needle was advanced under ultrasound guidance into the right common femoral artery. With excellent blood flow returned, micro wire was advanced. The needle was removed and a micropuncture set was advanced over the micro wire. The inner dilator in the stylet were removed, and an 035 Bentson wire was advanced into the abdominal aorta. The micro sheath was removed, a 5 [place] vascular sheath was placed into the common femoral artery. The dilator was removed and the sheath was flushed. A omni Flush catheter was advanced over the Bentson wire and used to select the left common  iliac artery. The wire was advanced into the internal iliac artery, and the Omni Flush catheter was exchanged for a 5 [place] cobra catheter. Cobra catheter was used to select the internal iliac artery. Angiogram of the left hypogastric artery was performed for identification of the left uterine artery. Once we identified the origin, a high-flow micro catheter was advanced to the Cobra catheter with a 0.014 Fathom wire. The micro catheter and micro wire combination were used to select the uterine artery. Angiogram was performed.  Once we confirmed position the micro catheter within the distal descending left uterine artery, embolization was performed. Embolization of the left uterine artery was performed with 2.0 vial of embospheres (500 - 700 micron). Embolization was performed to 5 beat stasis. The micro wire/micro catheter were removed, and the Cobra catheter and Bentson wire were used to form a Waltman loop. The Waltman loop was then used to select the ipsilateral hypogastric artery. Angiogram was performed to confirm position and identified the origin of the urine artery. Once we confirmed the origin, the same micro catheter and micro wire combination were used to select the uterine artery. The catheter was advanced to the distal descending right uterine artery. Angiogram was performed to confirm position. Embolization of the right uterine artery was performed with 1.5 vials of 500 -700 micro embospheres. Five beat stasis was achieved. The Waltman loop was reduced via the left common iliac artery with the assistance of the Bentson wire, and then the catheter and wire were removed. Angiogram of the right common femoral artery was performed. An Exoseal device was deployed for hemostasis. The patient tolerated the procedure well and remained hemodynamically stable throughout. No complications were encountered and no significant blood loss was encountered. IMPRESSION: Status post right common femoral artery access for bilateral uterine artery embolization as a treatment for symptomatic uterine fibroids. Exoseal was deployed for hemostasis. Signed, Dulcy Fanny. Earleen Newport, DO [facility] Radiology Electronically Signed   By: Corrie Mckusick D.O.   On: 04/03/2016 12:01   Ir Angiogram Selective Each Additional Vessel  Result Date: 04/03/2016 INDICATION: 35 year old female with a history of symptomatic uterine fibroids. She has additional history of Ehlers-Danlos syndrome. She has been referred for  uterine fibroid embolization. EXAM: IR EMBO ARTERIAL NOT HEMORR HEMANG INC GUIDE ROADMAPPING; ADDITIONAL ARTERIOGRAPHY; IR ULTRASOUND GUIDANCE VASC ACCESS RIGHT; PELVIC SELECTIVE ARTERIOGRAPHY MEDICATIONS: 2.0 g Ancef. The antibiotic was administered within 1 hour of the procedure ANESTHESIA/SEDATION: Moderate (conscious) sedation was employed during this procedure. A total of Versed 9.5 mg and Fentanyl for 75 mcg was administered intravenously. Moderate Sedation Time: 71 minutes. The patient's level of consciousness and vital signs were monitored continuously by radiology nursing throughout the procedure under my direct supervision. CONTRAST:  92mL ISOVUE-300 IOPAMIDOL (ISOVUE-300) INJECTION 61%, 58mL ISOVUE-300 IOPAMIDOL (ISOVUE-300) INJECTION 61% FLUOROSCOPY TIME:  Fluoroscopy Time: 18 minutes 0 seconds (2292 mGy). COMPLICATIONS: None PROCEDURE: The procedure, risks, benefits, and alternatives were explained to the patient. Questions regarding the procedure were encouraged and answered. The patient understands and consents to the procedure. The right groin was prepped with Betadine in a sterile fashion, and a sterile drape was applied covering the operative field. A sterile gown and sterile gloves were used for the procedure. Local anesthesia was provided with 1% Lidocaine. Ultrasound survey of the right inguinal region was performed with images stored and sent to PACs. The skin and subcutaneous tissue generously infiltrated with 1% lidocaine for local anesthesia, and a micropuncture needle was advanced under ultrasound  guidance into the right common femoral artery. With excellent blood flow returned, micro wire was advanced. The needle was removed and a micropuncture set was advanced over the micro wire. The inner dilator in the stylet were removed, and an 035 Bentson wire was advanced into the abdominal aorta. The micro sheath was removed, a 5 [place] vascular sheath was placed into the common femoral artery.  The dilator was removed and the sheath was flushed. A omni Flush catheter was advanced over the Bentson wire and used to select the left common iliac artery. The wire was advanced into the internal iliac artery, and the Omni Flush catheter was exchanged for a 5 [place] cobra catheter. Cobra catheter was used to select the internal iliac artery. Angiogram of the left hypogastric artery was performed for identification of the left uterine artery. Once we identified the origin, a high-flow micro catheter was advanced to the Cobra catheter with a 0.014 Fathom wire. The micro catheter and micro wire combination were used to select the uterine artery. Angiogram was performed. Once we confirmed position the micro catheter within the distal descending left uterine artery, embolization was performed. Embolization of the left uterine artery was performed with 2.0 vial of embospheres (500 - 700 micron). Embolization was performed to 5 beat stasis. The micro wire/micro catheter were removed, and the Cobra catheter and Bentson wire were used to form a Waltman loop. The Waltman loop was then used to select the ipsilateral hypogastric artery. Angiogram was performed to confirm position and identified the origin of the urine artery. Once we confirmed the origin, the same micro catheter and micro wire combination were used to select the uterine artery. The catheter was advanced to the distal descending right uterine artery. Angiogram was performed to confirm position. Embolization of the right uterine artery was performed with 1.5 vials of 500 -700 micro embospheres. Five beat stasis was achieved. The Waltman loop was reduced via the left common iliac artery with the assistance of the Bentson wire, and then the catheter and wire were removed. Angiogram of the right common femoral artery was performed. An Exoseal device was deployed for hemostasis. The patient tolerated the procedure well and remained hemodynamically stable  throughout. No complications were encountered and no significant blood loss was encountered. IMPRESSION: Status post right common femoral artery access for bilateral uterine artery embolization as a treatment for symptomatic uterine fibroids. Exoseal was deployed for hemostasis. Signed, Dulcy Fanny. Earleen Newport, DO [facility] Radiology Electronically Signed   By: Corrie Mckusick D.O.   On: 04/03/2016 12:01   Ir Angiogram Selective Each Additional Vessel  Result Date: 04/03/2016 INDICATION: 35 year old female with a history of symptomatic uterine fibroids. She has additional history of Ehlers-Danlos syndrome. She has been referred for uterine fibroid embolization. EXAM: IR EMBO ARTERIAL NOT HEMORR HEMANG INC GUIDE ROADMAPPING; ADDITIONAL ARTERIOGRAPHY; IR ULTRASOUND GUIDANCE VASC ACCESS RIGHT; PELVIC SELECTIVE ARTERIOGRAPHY MEDICATIONS: 2.0 g Ancef. The antibiotic was administered within 1 hour of the procedure ANESTHESIA/SEDATION: Moderate (conscious) sedation was employed during this procedure. A total of Versed 9.5 mg and Fentanyl for 75 mcg was administered intravenously. Moderate Sedation Time: 71 minutes. The patient's level of consciousness and vital signs were monitored continuously by radiology nursing throughout the procedure under my direct supervision. CONTRAST:  31mL ISOVUE-300 IOPAMIDOL (ISOVUE-300) INJECTION 61%, 46mL ISOVUE-300 IOPAMIDOL (ISOVUE-300) INJECTION 61% FLUOROSCOPY TIME:  Fluoroscopy Time: 18 minutes 0 seconds (2292 mGy). COMPLICATIONS: None PROCEDURE: The procedure, risks, benefits, and alternatives were explained to the patient.  Questions regarding the procedure were encouraged and answered. The patient understands and consents to the procedure. The right groin was prepped with Betadine in a sterile fashion, and a sterile drape was applied covering the operative field. A sterile gown and sterile gloves were used for the procedure. Local  anesthesia was provided with 1% Lidocaine. Ultrasound survey of the right inguinal region was performed with images stored and sent to PACs. The skin and subcutaneous tissue generously infiltrated with 1% lidocaine for local anesthesia, and a micropuncture needle was advanced under ultrasound guidance into the right common femoral artery. With excellent blood flow returned, micro wire was advanced. The needle was removed and a micropuncture set was advanced over the micro wire. The inner dilator in the stylet were removed, and an 035 Bentson wire was advanced into the abdominal aorta. The micro sheath was removed, a 5 [place] vascular sheath was placed into the common femoral artery. The dilator was removed and the sheath was flushed. A omni Flush catheter was advanced over the Bentson wire and used to select the left common iliac artery. The wire was advanced into the internal iliac artery, and the Omni Flush catheter was exchanged for a 5 [place] cobra catheter. Cobra catheter was used to select the internal iliac artery. Angiogram of the left hypogastric artery was performed for identification of the left uterine artery. Once we identified the origin, a high-flow micro catheter was advanced to the Cobra catheter with a 0.014 Fathom wire. The micro catheter and micro wire combination were used to select the uterine artery. Angiogram was performed. Once we confirmed position the micro catheter within the distal descending left uterine artery, embolization was performed. Embolization of the left uterine artery was performed with 2.0 vial of embospheres (500 - 700 micron). Embolization was performed to 5 beat stasis. The micro wire/micro catheter were removed, and the Cobra catheter and Bentson wire were used to form a Waltman loop. The Waltman loop was then used to select the ipsilateral hypogastric artery. Angiogram was performed to confirm position and identified the origin of the urine artery. Once we confirmed the  origin, the same micro catheter and micro wire combination were used to select the uterine artery. The catheter was advanced to the distal descending right uterine artery. Angiogram was performed to confirm position. Embolization of the right uterine artery was performed with 1.5 vials of 500 -700 micro embospheres. Five beat stasis was achieved. The Waltman loop was reduced via the left common iliac artery with the assistance of the Bentson wire, and then the catheter and wire were removed. Angiogram of the right common femoral artery was performed. An Exoseal device was deployed for hemostasis. The patient tolerated the procedure well and remained hemodynamically stable throughout. No complications were encountered and no significant blood loss was encountered. IMPRESSION: Status post right common femoral artery access for bilateral uterine artery embolization as a treatment for symptomatic uterine fibroids. Exoseal was deployed for hemostasis. Signed, Dulcy Fanny. Earleen Newport, DO [facility] Radiology Electronically Signed   By: Corrie Mckusick D.O.   On: 04/03/2016 12:01   Ir US Guide Vasc Access Right  Result Date: 04/03/2016 INDICATION: 35 year old female with a history of symptomatic uterine fibroids. She has additional history of Ehlers-Danlos syndrome. She has been referred for uterine fibroid embolization. EXAM: IR EMBO ARTERIAL NOT HEMORR HEMANG INC GUIDE ROADMAPPING; ADDITIONAL ARTERIOGRAPHY; IR ULTRASOUND GUIDANCE VASC ACCESS RIGHT; PELVIC SELECTIVE ARTERIOGRAPHY MEDICATIONS: 2.0 g Ancef. The antibiotic was administered within 1  hour of the procedure ANESTHESIA/SEDATION: Moderate (conscious) sedation was employed during this procedure. A total of Versed 9.5 mg and Fentanyl for 75 mcg was administered intravenously. Moderate Sedation Time: 71 minutes. The patient's level of consciousness and vital signs were monitored continuously by radiology nursing throughout  the procedure under my direct supervision. CONTRAST:  34mL ISOVUE-300 IOPAMIDOL (ISOVUE-300) INJECTION 61%, 30mL ISOVUE-300 IOPAMIDOL (ISOVUE-300) INJECTION 61% FLUOROSCOPY TIME:  Fluoroscopy Time: 18 minutes 0 seconds (2292 mGy). COMPLICATIONS: None PROCEDURE: The procedure, risks, benefits, and alternatives were explained to the patient. Questions regarding the procedure were encouraged and answered. The patient understands and consents to the procedure. The right groin was prepped with Betadine in a sterile fashion, and a sterile drape was applied covering the operative field. A sterile gown and sterile gloves were used for the procedure. Local anesthesia was provided with 1% Lidocaine. Ultrasound survey of the right inguinal region was performed with images stored and sent to PACs. The skin and subcutaneous tissue generously infiltrated with 1% lidocaine for local anesthesia, and a micropuncture needle was advanced under ultrasound guidance into the right common femoral artery. With excellent blood flow returned, micro wire was advanced. The needle was removed and a micropuncture set was advanced over the micro wire. The inner dilator in the stylet were removed, and an 035 Bentson wire was advanced into the abdominal aorta. The micro sheath was removed, a 5 [place] vascular sheath was placed into the common femoral artery. The dilator was removed and the sheath was flushed. A omni Flush catheter was advanced over the Bentson wire and used to select the left common iliac artery. The wire was advanced into the internal iliac artery, and the Omni Flush catheter was exchanged for a 5 [place] cobra catheter. Cobra catheter was used to select the internal iliac artery. Angiogram of the left hypogastric artery was performed for identification of the left uterine artery. Once we identified the origin, a high-flow micro catheter was advanced to the Cobra catheter with a 0.014 Fathom wire. The micro catheter and micro wire  combination were used to select the uterine artery. Angiogram was performed. Once we confirmed position the micro catheter within the distal descending left uterine artery, embolization was performed. Embolization of the left uterine artery was performed with 2.0 vial of embospheres (500 - 700 micron). Embolization was performed to 5 beat stasis. The micro wire/micro catheter were removed, and the Cobra catheter and Bentson wire were used to form a Waltman loop. The Waltman loop was then used to select the ipsilateral hypogastric artery. Angiogram was performed to confirm position and identified the origin of the urine artery. Once we confirmed the origin, the same micro catheter and micro wire combination were used to select the uterine artery. The catheter was advanced to the distal descending right uterine artery. Angiogram was performed to confirm position. Embolization of the right uterine artery was performed with 1.5 vials of 500 -700 micro embospheres. Five beat stasis was achieved. The Waltman loop was reduced via the left common iliac artery with the assistance of the Bentson wire, and then the catheter and wire were removed. Angiogram of the right common femoral artery was performed. An Exoseal device was deployed for hemostasis. The patient tolerated the procedure well and remained hemodynamically stable throughout. No complications were encountered and no significant blood loss was encountered. IMPRESSION: Status post right common femoral artery access for bilateral uterine artery embolization as a treatment for symptomatic uterine fibroids. Exoseal was deployed for hemostasis. Signed, York Cerise  Pasty Arch, DO [facility] Radiology Electronically Signed   By: Corrie Mckusick D.O.   On: 04/03/2016 12:01   Ir Embo Tumor Organ Ischemia Infarct Inc Guide Roadmapping  Result Date: 04/03/2016 INDICATION: 35 year old female with a history of symptomatic uterine  fibroids. She has additional history of Ehlers-Danlos syndrome. She has been referred for uterine fibroid embolization. EXAM: IR EMBO ARTERIAL NOT HEMORR HEMANG INC GUIDE ROADMAPPING; ADDITIONAL ARTERIOGRAPHY; IR ULTRASOUND GUIDANCE VASC ACCESS RIGHT; PELVIC SELECTIVE ARTERIOGRAPHY MEDICATIONS: 2.0 g Ancef. The antibiotic was administered within 1 hour of the procedure ANESTHESIA/SEDATION: Moderate (conscious) sedation was employed during this procedure. A total of Versed 9.5 mg and Fentanyl for 75 mcg was administered intravenously. Moderate Sedation Time: 71 minutes. The patient's level of consciousness and vital signs were monitored continuously by radiology nursing throughout the procedure under my direct supervision. CONTRAST:  64mL ISOVUE-300 IOPAMIDOL (ISOVUE-300) INJECTION 61%, 71mL ISOVUE-300 IOPAMIDOL (ISOVUE-300) INJECTION 61% FLUOROSCOPY TIME:  Fluoroscopy Time: 18 minutes 0 seconds (2292 mGy). COMPLICATIONS: None PROCEDURE: The procedure, risks, benefits, and alternatives were explained to the patient. Questions regarding the procedure were encouraged and answered. The patient understands and consents to the procedure. The right groin was prepped with Betadine in a sterile fashion, and a sterile drape was applied covering the operative field. A sterile gown and sterile gloves were used for the procedure. Local anesthesia was provided with 1% Lidocaine. Ultrasound survey of the right inguinal region was performed with images stored and sent to PACs. The skin and subcutaneous tissue generously infiltrated with 1% lidocaine for local anesthesia, and a micropuncture needle was advanced under ultrasound guidance into the right common femoral artery. With excellent blood flow returned, micro wire was advanced. The needle was removed and a micropuncture set was advanced over the micro wire. The inner dilator in the stylet were removed, and an 035 Bentson wire was advanced into the abdominal aorta. The micro  sheath was removed, a 5 [place] vascular sheath was placed into the common femoral artery. The dilator was removed and the sheath was flushed. A omni Flush catheter was advanced over the Bentson wire and used to select the left common iliac artery. The wire was advanced into the internal iliac artery, and the Omni Flush catheter was exchanged for a 5 [place] cobra catheter. Cobra catheter was used to select the internal iliac artery. Angiogram of the left hypogastric artery was performed for identification of the left uterine artery. Once we identified the origin, a high-flow micro catheter was advanced to the Cobra catheter with a 0.014 Fathom wire. The micro catheter and micro wire combination were used to select the uterine artery. Angiogram was performed. Once we confirmed position the micro catheter within the distal descending left uterine artery, embolization was performed. Embolization of the left uterine artery was performed with 2.0 vial of embospheres (500 - 700 micron). Embolization was performed to 5 beat stasis. The micro wire/micro catheter were removed, and the Cobra catheter and Bentson wire were used to form a Waltman loop. The Waltman loop was then used to select the ipsilateral hypogastric artery. Angiogram was performed to confirm position and identified the origin of the urine artery. Once we confirmed the origin, the same micro catheter and micro wire combination were used to select the uterine artery. The catheter was advanced to the distal descending right uterine artery. Angiogram was performed to confirm position. Embolization of the right uterine artery was performed with 1.5 vials of 500 -700 micro embospheres. Five  beat stasis was achieved. The Waltman loop was reduced via the left common iliac artery with the assistance of the Bentson wire, and then the catheter and wire were removed. Angiogram of the right common femoral artery was performed. An Exoseal device was deployed for  hemostasis. The patient tolerated the procedure well and remained hemodynamically stable throughout. No complications were encountered and no significant blood loss was encountered. IMPRESSION: Status post right common femoral artery access for bilateral uterine artery embolization as a treatment for symptomatic uterine fibroids. Exoseal was deployed for hemostasis. Signed, Dulcy Fanny. Earleen Newport, DO [facility] Radiology Electronically Signed   By: Corrie Mckusick D.O.   On: 04/03/2016 12:01    Labs:  CBC:  Recent Labs  04/03/16 0800  WBC 8.3  HGB 10.5*  HCT 35.7*  PLT 336    COAGS:  Recent Labs  04/03/16 0800  INR 0.99    BMP:  Recent Labs  04/03/16 0800  NA 137  K 3.6  CL 107  CO2 24  GLUCOSE 95  BUN 11  CALCIUM 9.0  CREATININE 0.59  GFRNONAA >60  GFRAA >60    LIVER FUNCTION TESTS: No results for input(s): BILITOT, AST, ALT, ALKPHOS, PROT, ALBUMIN in the last 8760 hours.  TUMOR MARKERS: No results for input(s): AFPTM, CEA, CA199, CHROMGRNA in the last 8760 hours.  Assessment and Plan:  Ms. Birchard is a 35 year old female status post bilateral uterine artery embolization for symptomatic uterine fibroids. This was performed 04/03/2016, and she is recovering quite well.  At this point, she may contact us at any time should she have any concerns, and we will schedule her next appointment for 6 months postop. We will defer any imaging unless she has any issues.  Again, I have stressed to her the importance of being seen should she experience any new onset foul-smelling discharge or fevers, or any increasing pain, given the intracavitary location of one of her fibroids. She understands.  1-- We will schedule for routine office appointment in 6 months post treatment.    Electronically Signed: Corrie Mckusick 04/17/2016, 4:45 PM   I spent a total of    25 Minutes in face to face in clinical consultation, greater than 50%  of which was counseling/coordinating care for symptomatic uterine fibroids, status post bilateral uterine artery embolization.

## 2016-05-20 ENCOUNTER — Encounter: Payer: Self-pay | Admitting: Radiology

## 2016-07-03 ENCOUNTER — Encounter: Payer: Self-pay | Admitting: Interventional Radiology

## 2016-10-02 ENCOUNTER — Other Ambulatory Visit: Payer: Self-pay | Admitting: Interventional Radiology

## 2016-10-02 DIAGNOSIS — D259 Leiomyoma of uterus, unspecified: Secondary | ICD-10-CM

## 2016-10-08 ENCOUNTER — Ambulatory Visit
Admission: RE | Admit: 2016-10-08 | Discharge: 2016-10-08 | Disposition: A | Discharge status: Home / Self Care | Payer: Managed Care, Other (non HMO) | Source: Ambulatory Visit | Attending: Interventional Radiology | Admitting: Interventional Radiology

## 2016-10-08 DIAGNOSIS — D259 Leiomyoma of uterus, unspecified: Secondary | ICD-10-CM

## 2016-10-08 HISTORY — PX: IR RADIOLOGIST EVAL & MGMT: IMG5224

## 2016-10-08 NOTE — Progress Notes (Signed)
Chief Complaint: SP UFE for symptomatic fibroids  Referring Physician(s): Dr. Allyn Park  History of Present Illness: Shelby Park is a 36 y.o. female presenting as a scheduled 6 month postop visit with vascular and interventional radiology today, previously treated for her symptomatic uterine fibroids. Her treatment was performed 04/03/2016, at which time we performed bilateral uterine artery embolization.   At the time of her initial consultation generic 2017, her main complaints were for dysfunctional uterine bleeding, she reported having at least 14 days of heavy bleeding with heavy clots infrequent tampon and pad changes. In addition, she had inter-cycle bleeding. She also complained of bulk-type symptoms with urinary frequency and sensation of incomplete bladder emptying.  Since our treatment was performed, she is happy to report that she has had significant decrease time during her monthly cycles, which do continue every month. She tells me that they have been reduced in time to a more normal length of approximately 5-6 days. Her heaviest day is around day 3, with much less bleeding than previous. She is reporting less frequent had an tampon changes with only one heavy day and occasional clots.  She reports that she is quite satisfied with her outcome.  She has no other concerns.    Past Medical History:  Diagnosis Date  . Ehlers-Danlos disease   . Fibroids   . Headache   . Heart murmur     Past Surgical History:  Procedure Laterality Date  . IR GENERIC HISTORICAL  04/03/2016   IR US GUIDE VASC ACCESS RIGHT 04/03/2016 Shelby Mckusick, DO [facility]  . IR GENERIC HISTORICAL  04/03/2016   IR ANGIOGRAM PELVIS SELECTIVE OR SUPRASELECTIVE 04/03/2016 Shelby Mckusick, DO [facility]  . IR GENERIC HISTORICAL  04/03/2016   IR ANGIOGRAM PELVIS SELECTIVE OR SUPRASELECTIVE 04/03/2016 Shelby Mckusick, DO [facility]  . IR GENERIC HISTORICAL  04/03/2016   IR ANGIOGRAM SELECTIVE EACH  ADDITIONAL VESSEL 04/03/2016 Shelby Mckusick, DO [facility]  . IR GENERIC HISTORICAL  04/03/2016   IR ANGIOGRAM SELECTIVE EACH ADDITIONAL VESSEL 04/03/2016 Shelby Mckusick, DO [facility]  . IR GENERIC HISTORICAL  04/03/2016   IR EMBO TUMOR ORGAN ISCHEMIA INFARCT INC GUIDE ROADMAPPING 04/03/2016 Shelby Mckusick, DO [facility]  . IR GENERIC HISTORICAL  04/17/2016   IR RADIOLOGIST EVAL & MGMT 04/17/2016 [facility]  . IR GENERIC HISTORICAL  03/13/2016   IR RADIOLOGIST EVAL & MGMT 03/13/2016 Shelby Mckusick, DO [facility]  . WISDOM TOOTH EXTRACTION      Allergies: Other  Medications: Prior to Admission medications   Medication Sig Start Date End Date Taking? Authorizing Provider  Multiple Vitamin (MULTIVITAMIN) tablet Take 1 tablet by mouth daily.   Yes Historical Provider, MD  vitamin C (ASCORBIC ACID) 500 MG tablet Take 500 mg by mouth daily.   Yes Historical Provider, MD  VITAMIN E PO Take 1 capsule by mouth daily.   Yes Historical Provider, MD  OVER THE COUNTER MEDICATION Take 1 capsule by mouth daily. Red raspberry leaf extract    Historical Provider, MD     No family history on file.  Social History   Social History  . Marital status: Single    Spouse name: N/A  . Number of children: N/A  . Years of education: N/A   Social History Main Topics  . Smoking status: Former Research scientist (life sciences)  . Smokeless tobacco: Never Used     Comment: around age 21  . Alcohol use Not on file  . Drug use: Unknown  .  Sexual activity: Not on file   Other Topics Concern  . Not on file   Social History Narrative  . No narrative on file     Review of Systems: A 12 point ROS discussed and pertinent positives are indicated in the HPI above.  All other systems are negative.  Review of Systems  Vital Signs: BP 128/65 (BP Location: Left Arm, Patient Position: Sitting, Cuff Size: Normal)   Pulse 78   Temp 98.6 F (37 C) (Oral)   Resp 14   Ht 5\' 6"  (1.676 m)   Wt 197 lb (89.4 kg)   LMP  09/16/2016   SpO2 98%   BMI 31.80 kg/m   Physical Exam   Imaging: No results found.  Labs:  CBC:  Recent Labs  04/03/16 0800  WBC 8.3  HGB 10.5*  HCT 35.7*  PLT 336    COAGS:  Recent Labs  04/03/16 0800  INR 0.99    BMP:  Recent Labs  04/03/16 0800  NA 137  K 3.6  CL 107  CO2 24  GLUCOSE 95  BUN 11  CALCIUM 9.0  CREATININE 0.59  GFRNONAA >60  GFRAA >60    LIVER FUNCTION TESTS: No results for input(s): BILITOT, AST, ALT, ALKPHOS, PROT, ALBUMIN in the last 8760 hours.  TUMOR MARKERS: No results for input(s): AFPTM, CEA, CA199, CHROMGRNA in the last 8760 hours.  Assessment and Plan:  Shelby Park is a 36 year old female status post bilateral uterine artery embolization for treatment of symptomatic uterine fibroids, and she reports being quite satisfied with her outcome.  My discussion was mostly regarding expectations up to a year. Our most reliable data regarding uterine artery embolization for symptomatic uterine fibroids tells [place] that the best efficacy occurs at 12 months postoperative, and she may continue to see changes until that time.  We would not schedule her at this time for any future appointment, and she knows she may call us at any time should she have any problems or questions.  She seems perfectly satisfied with our treatment strategy.  Electronically Signed: Corrie Park 10/08/2016, 5:26 PM   I spent a total of    15 Minutes in face to face in clinical consultation, greater than 50% of which was counseling/coordinating care for bilateral uterine artery embolization for symptomatic uterine fibroids.

## 2016-10-08 NOTE — Progress Notes (Signed)
LMP:  09/16/2016.   Frequency:  Every 28 days.  Length:  6 days (3rd day heavy w/ some clots).  Denies menstrual cramps, abd discomfort.    Ovadia Lopp Riki Rusk, [place] 10/08/2016 3:36 PM

## 2016-12-12 ENCOUNTER — Encounter: Payer: Self-pay | Admitting: Interventional Radiology

## 2017-04-13 IMAGING — XA IR ANGIO/ADD [PERSON_NAME]
12 of 18 series · 12 of 24 positions shown · IV contrast (IODINE)
Comparison: none

INDICATION: 34-year-old female with a history of symptomatic uterine fibroids.
She has additional history of Ehlers-Danlos syndrome.

[Series 1: ufe · 1 of 15 frames shown (1 of 10)]
[frame 13/15]
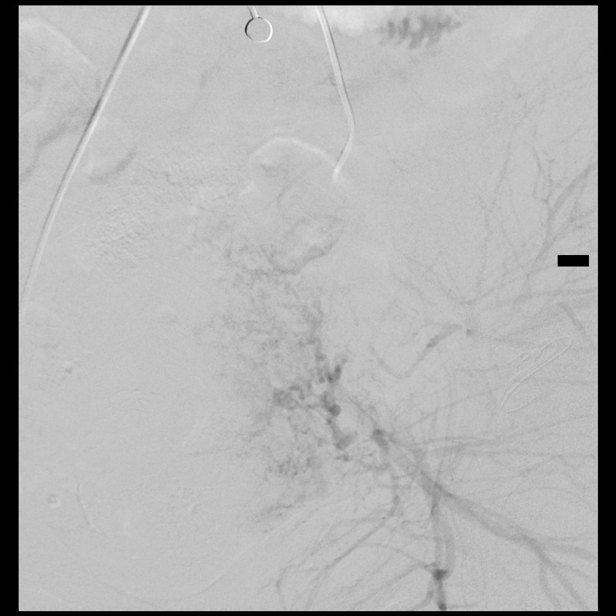

[Series 3: ufe · 1 of 16 frames shown (2 of 10)]
[frame 3/16]
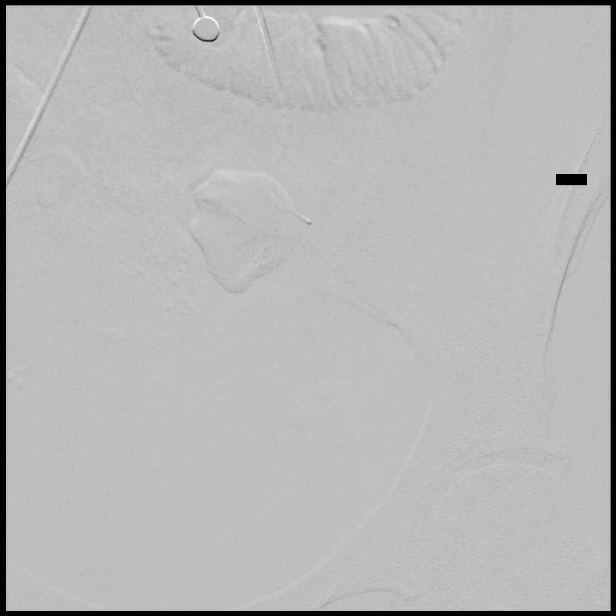

[Series 4: ufe · 1 of 16 frames shown (3 of 10)]
[frame 11/16]
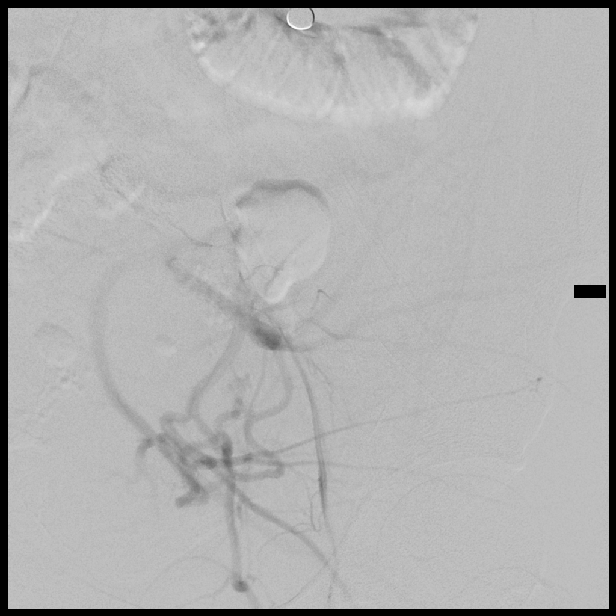

[Series 5: ufe · 1 of 16 frames shown (4 of 10)]
[frame 14/16]
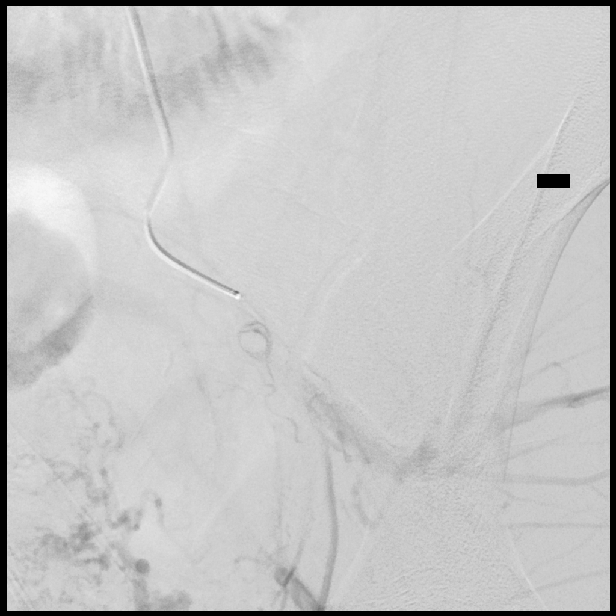

[Series 7: ufe · 1 of 23 frames shown (5 of 10)]
[frame 12/23]
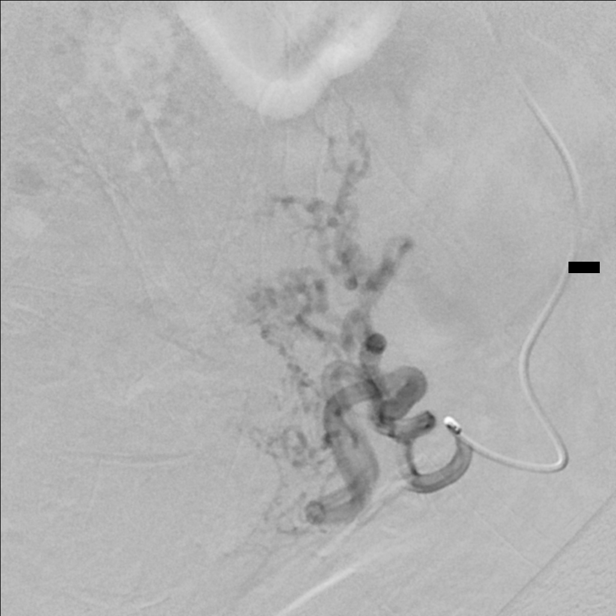

[Series 8: ufe · 1 of 28 frames shown (6 of 10)]
[frame 24/28]
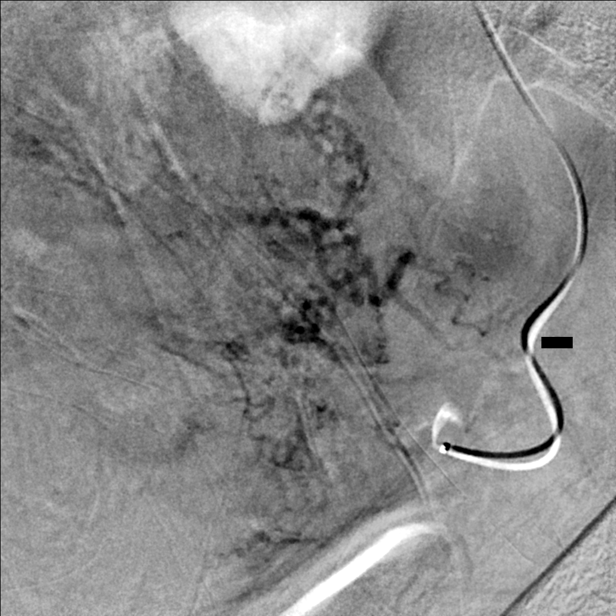

[Series 10: ufe · 1 of 13 frames shown (7 of 10)]
[frame 2/13]
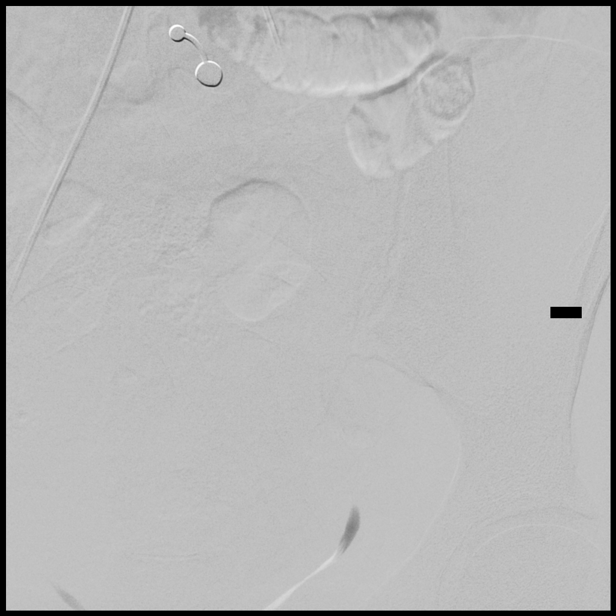

[Series 11: ufe · 1 of 13 frames shown (8 of 10)]
[frame 12/13]
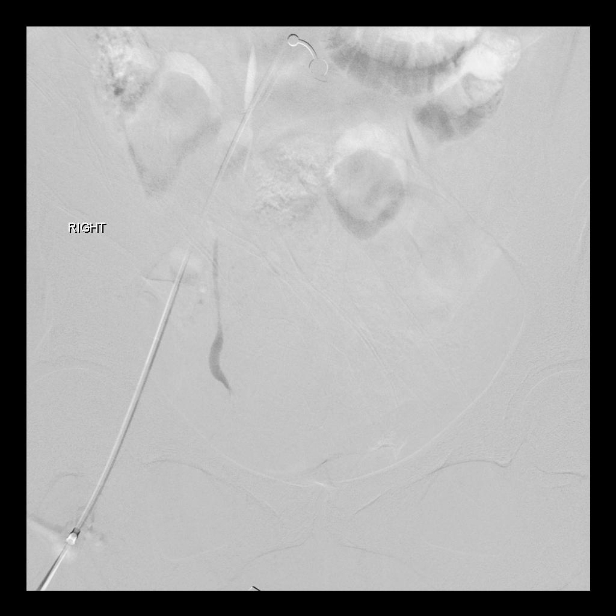

[Series 13: ufe · 1 of 18 frames shown (9 of 10)]
[frame 10/18]
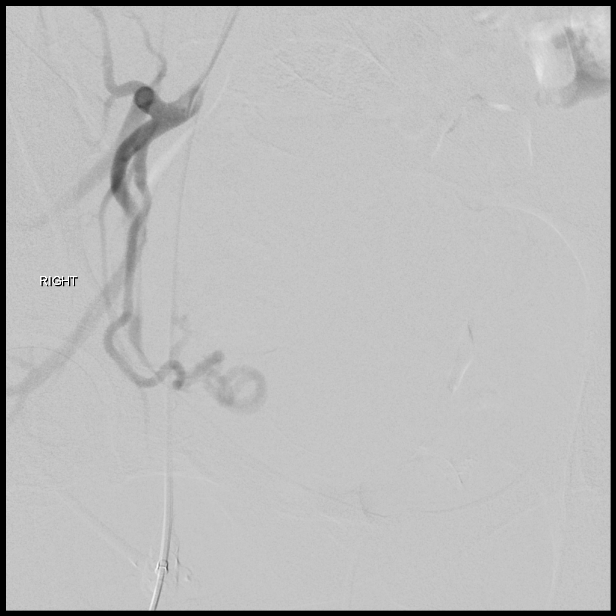

[Series 14: ufe · 1 of 32 frames shown (10 of 10)]
[frame 31/32]
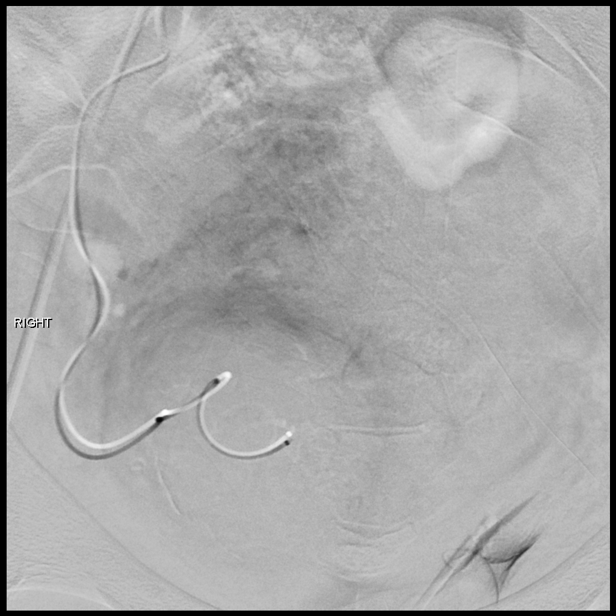

[Series 16: care single · 1 of 1 slices shown]
[im 1/1]
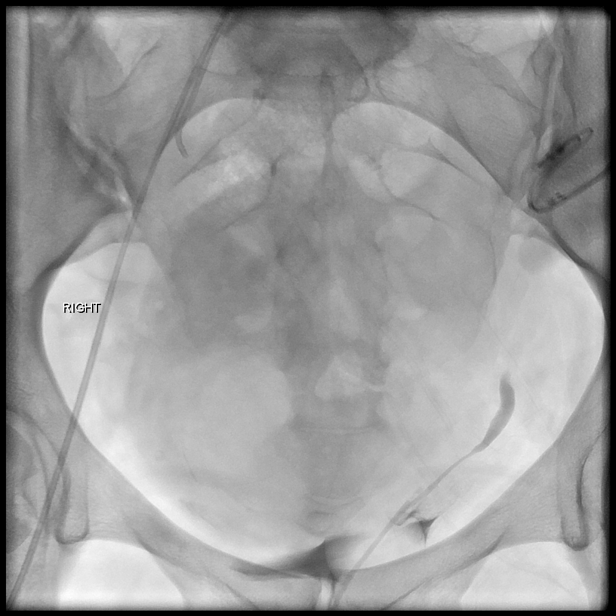

[Series 300: ld dsa body · 1 of 1 slices shown]
[im 1/1]
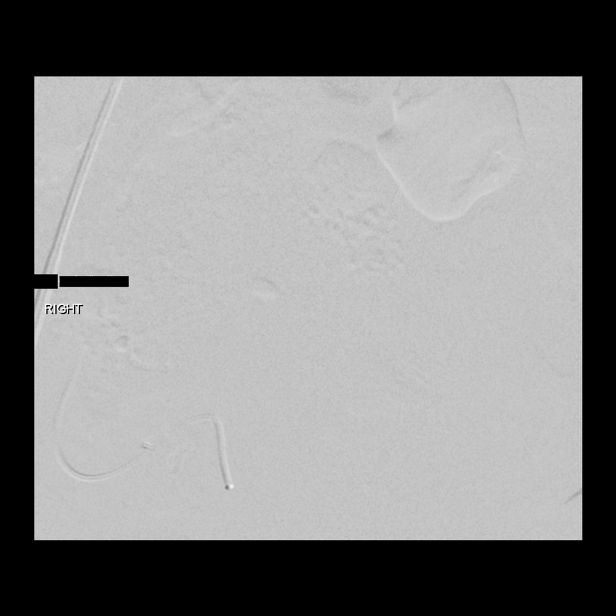

[12 of 24 positions shown; findings below may reference images not displayed]

She has been referred for uterine fibroid embolization.

EXAM:
IR EMBO ARTERIAL NOT HEMORR SZALATNYAI INC GUIDE ROADMAPPING; ADDITIONAL
ARTERIOGRAPHY; IR ULTRASOUND GUIDANCE VASC ACCESS RIGHT; PELVIC
SELECTIVE ARTERIOGRAPHY

MEDICATIONS:
2.0 g Ancef. The antibiotic was administered within 1 hour of the
procedure

ANESTHESIA/SEDATION:
Moderate (conscious) sedation was employed during this procedure. A
total of Versed 9.5 mg and Fentanyl for 75 mcg was administered
intravenously.

Moderate Sedation Time: 71 minutes. The patient's level of
consciousness and vital signs were monitored continuously by
radiology nursing throughout the procedure under my direct
supervision.

CONTRAST:  40mL 4ZBCLB-ZTT IOPAMIDOL (4ZBCLB-ZTT) INJECTION 61%,
17mL 4ZBCLB-ZTT IOPAMIDOL (4ZBCLB-ZTT) INJECTION 61%

FLUOROSCOPY TIME:  Fluoroscopy Time: 18 minutes 0 seconds (2272
mGy).

COMPLICATIONS:
None

PROCEDURE:
The procedure, risks, benefits, and alternatives were explained to
the patient. Questions regarding the procedure were encouraged and
answered. The patient understands and consents to the procedure.

The right groin was prepped with Betadine in a sterile fashion, and
a sterile drape was applied covering the operative field. A sterile
gown and sterile gloves were used for the procedure. Local
anesthesia was provided with 1% Lidocaine.

Ultrasound survey of the right inguinal region was performed with
images stored and sent to PACs.

The skin and subcutaneous tissue generously infiltrated with 1%
lidocaine for local anesthesia, and a micropuncture needle was
advanced under ultrasound guidance into the right common femoral
artery. With excellent blood flow returned, micro wire was advanced.
The needle was removed and a micropuncture set was advanced over the
micro wire. The inner dilator in the stylet were removed, and an 035
Bentson wire was advanced into the abdominal aorta. The micro sheath
was removed, a 5 [place] vascular sheath was placed into the common
femoral artery. The dilator was removed and the sheath was flushed.

A omni Flush catheter was advanced over the Bentson wire and used to
select the left common iliac artery. The wire was advanced into the
internal iliac artery, and the Omni Flush catheter was exchanged for
a 5 [place] cobra catheter. Cobra catheter was used to select the
internal iliac artery.

Angiogram of the left hypogastric artery was performed for
identification of the left uterine artery. Once we identified the
origin, a high-flow micro catheter was advanced to the Cobra
catheter with a 0.014 Fathom wire. The micro catheter and micro wire
combination were used to select the uterine artery.

Angiogram was performed.

Once we confirmed position the micro catheter within the distal
descending left uterine artery, embolization was performed.
Embolization of the left uterine artery was performed with 2.0 vial
of embospheres (500 - 700 micron). Embolization was performed to 5
beat stasis.

The micro wire/micro catheter were removed, and the Cobra catheter
and Bentson wire were used to form Giorgi Jumper loop. The Saul Rodrigo loop
was then used to select the ipsilateral hypogastric artery.

Angiogram was performed to confirm position and identified the
origin of the urine artery. Once we confirmed the origin, the same
micro catheter and micro wire combination were used to select the
uterine artery. The catheter was advanced to the distal descending
right uterine artery. Angiogram was performed to confirm position.

Embolization of the right uterine artery was performed with
vials of 500 -700 micro embospheres. Five beat stasis was achieved.

The Saul Rodrigo loop was reduced via the left common iliac artery with
the assistance of the Bentson wire, and then the catheter and wire
were removed.

Angiogram of the right common femoral artery was performed.

An Exoseal device was deployed for hemostasis.

The patient tolerated the procedure well and remained
hemodynamically stable throughout.

No complications were encountered and no significant blood loss was
encountered.
IMPRESSION: Status post right common femoral artery access for bilateral uterine
artery embolization as a treatment for symptomatic uterine fibroids.

Exoseal was deployed for hemostasis.
